# Patient Record
Sex: Male | Born: 1975 | Race: Black or African American | Hispanic: No | Marital: Single | Smoking: Never smoker
Health system: Southern US, Community
[De-identification: ages and names within clinical notes are randomized; demographics above are authoritative.]

## PROBLEM LIST (undated history)

## (undated) HISTORY — PX: OTHER SURGICAL HISTORY: SHX169

---

## 2006-03-15 ENCOUNTER — Emergency Department: Payer: Self-pay | Admitting: Emergency Medicine

## 2014-09-02 ENCOUNTER — Ambulatory Visit
Admit: 2014-09-02 | Disposition: A | Discharge status: Home / Self Care | Payer: Self-pay | Attending: Family Medicine | Admitting: Family Medicine

## 2016-02-16 ENCOUNTER — Encounter: Payer: Self-pay | Admitting: Emergency Medicine

## 2016-02-16 ENCOUNTER — Ambulatory Visit
Admission: EM | Admit: 2016-02-16 | Discharge: 2016-02-16 | Disposition: A | Discharge status: Home / Self Care | Payer: Managed Care, Other (non HMO) | Attending: Family Medicine | Admitting: Family Medicine

## 2016-02-16 DIAGNOSIS — M545 Low back pain, unspecified: Secondary | ICD-10-CM

## 2016-02-16 MED ORDER — HYDROCODONE-ACETAMINOPHEN 5-325 MG PO TABS: ORAL_TABLET | ORAL | 0 refills | Status: DC

## 2016-02-16 MED ORDER — CYCLOBENZAPRINE HCL 10 MG PO TABS: 10.0000 mg | ORAL_TABLET | Freq: Every day | ORAL | 0 refills | Status: DC

## 2016-02-16 NOTE — ED Provider Notes (Signed)
[facility]    CSN: [id number] Arrival date & time: 02/16/16  1041     History   Chief Complaint Chief Complaint  Patient presents with  . Back Pain    HPI Nthony Francesco RunnerH Shugart is a 40 y.o. male.   40 yo male with a c/o 4 days of right sided low back pain. Denies any falls or other traumatic injury, numbness/tingling, fevers, chills, saddle anesthesia, bowel/bladder problems,  pain radiating. Pain is worse with certain movements or positions.    The history is provided by the patient.    History reviewed. No pertinent past medical history.  There are no active problems to display for this patient.   History reviewed. No pertinent surgical history.     Home Medications    Prior to Admission medications   Medication Sig Start Date End Date Taking? Authorizing Provider  cyclobenzaprine (FLEXERIL) 10 MG tablet Take 1 tablet (10 mg total) by mouth at bedtime. 02/16/16   Payton Mccallumrlando Lorita Forinash, MD  HYDROcodone-acetaminophen (NORCO/VICODIN) 5-325 MG tablet 1-2 tablet po qhs prn 02/16/16   Payton Mccallumrlando Reade Trefz, MD    Family History History reviewed. No pertinent family history.  Social History Social History  Substance Use Topics  . Smoking status: Never Smoker  . Smokeless tobacco: Never Used  . Alcohol use Yes     Allergies   Penicillins   Review of Systems Review of Systems   Physical Exam Triage Vital Signs ED Triage Vitals  Enc Vitals Group     BP 02/16/16 1123 (!) 142/90     Pulse Rate 02/16/16 1123 80     Resp 02/16/16 1123 16     Temp 02/16/16 1123 97.6 F (36.4 C)     Temp Source 02/16/16 1123 Tympanic     SpO2 02/16/16 1123 98 %     Weight 02/16/16 1121 260 lb (117.9 kg)     Height 02/16/16 1121 6\' 2"  (1.88 m)     Head Circumference --      Peak Flow --      Pain Score 02/16/16 1123 8     Pain Loc --      Pain Edu? --      Excl. in GC? --    No data found.   Updated Vital Signs BP (!) 142/90 (BP Location: Left Arm)   Pulse 80   Temp 97.6  F (36.4 C) (Tympanic)   Resp 16   Ht 6\' 2"  (1.88 m)   Wt 260 lb (117.9 kg)   SpO2 98%   BMI 33.38 kg/m   Visual Acuity Right Eye Distance:   Left Eye Distance:   Bilateral Distance:    Right Eye Near:   Left Eye Near:    Bilateral Near:     Physical Exam  Constitutional: He appears well-developed and well-nourished. No distress.  Neck: Normal range of motion. Neck supple. No tracheal deviation present.  Pulmonary/Chest: Effort normal. No stridor. No respiratory distress.  Musculoskeletal:       Cervical back: He exhibits normal range of motion, no tenderness, no bony tenderness, no swelling, no edema, no deformity, no laceration, no pain, no spasm and normal pulse.       Lumbar back: He exhibits tenderness (over the right lumbar paraspinous muscles) and spasm. He exhibits normal range of motion, no bony tenderness, no swelling, no edema, no deformity, no laceration, no pain and normal pulse.  Neurological: He is alert. He has normal reflexes. He displays normal reflexes. He exhibits  normal muscle tone. Coordination normal.  Skin: No rash noted. He is not diaphoretic.  Nursing note and vitals reviewed.    UC Treatments / Results  Labs (all labs ordered are listed, but only abnormal results are displayed) Labs Reviewed - No data to display  EKG  EKG Interpretation None       Radiology No results found.  Procedures Procedures (including critical care time)  Medications Ordered in UC Medications - No data to display   Initial Impression / Assessment and Plan / UC Course  I have reviewed the triage vital signs and the nursing notes.  Pertinent labs & imaging results that were available during my care of the patient were reviewed by me and considered in my medical decision making (see chart for details).  Clinical Course      Final Clinical Impressions(s) / UC Diagnoses   Final diagnoses:  Acute right-sided low back pain without sciatica    New  Prescriptions Discharge Medication List as of 02/16/2016 11:40 AM    START taking these medications   Details  cyclobenzaprine (FLEXERIL) 10 MG tablet Take 1 tablet (10 mg total) by mouth at bedtime., Starting Mon 02/16/2016, Normal    HYDROcodone-acetaminophen (NORCO/VICODIN) 5-325 MG tablet 1-2 tablet po qhs prn, Print        1. diagnosis reviewed with patient 2. rx as per orders above; reviewed possible side effects, interactions, risks and benefits  3. Recommend supportive treatment with heat/ice, stretches 4. Follow-up prn if symptoms worsen or don't improve    Payton Mccallum, MD 02/16/16 1338

## 2016-02-16 NOTE — ED Triage Notes (Signed)
Patient c/o lower back pain that started Friday.  Patient denies injury.

## 2016-02-18 ENCOUNTER — Telehealth: Payer: Self-pay

## 2016-02-18 NOTE — Telephone Encounter (Signed)
Courtesy call back completed today for patient's recent visit at [facility]. Patient did not answer, left message on machine to call back with any questions or concerns.   

## 2017-01-18 ENCOUNTER — Ambulatory Visit: Payer: Self-pay | Admitting: Family Medicine

## 2017-02-22 ENCOUNTER — Ambulatory Visit: Payer: Self-pay | Admitting: Family Medicine

## 2018-02-03 ENCOUNTER — Emergency Department
Admission: EM | Admit: 2018-02-03 | Discharge: 2018-02-03 | Disposition: A | Discharge status: Home / Self Care | Payer: BLUE CROSS/BLUE SHIELD | Attending: Emergency Medicine | Admitting: Emergency Medicine

## 2018-02-03 ENCOUNTER — Emergency Department: Payer: BLUE CROSS/BLUE SHIELD

## 2018-02-03 ENCOUNTER — Other Ambulatory Visit: Payer: Self-pay

## 2018-02-03 DIAGNOSIS — N23 Unspecified renal colic: Secondary | ICD-10-CM

## 2018-02-03 DIAGNOSIS — N5082 Scrotal pain: Secondary | ICD-10-CM | POA: Diagnosis not present

## 2018-02-03 DIAGNOSIS — R1031 Right lower quadrant pain: Secondary | ICD-10-CM | POA: Diagnosis present

## 2018-02-03 DIAGNOSIS — N50811 Right testicular pain: Secondary | ICD-10-CM

## 2018-02-03 LAB — COMPREHENSIVE METABOLIC PANEL
ALT: 31 U/L (ref 0–44)
AST: 27 U/L (ref 15–41)
Albumin: 4.6 g/dL (ref 3.5–5.0)
Alkaline Phosphatase: 81 U/L (ref 38–126)
Anion gap: 9 (ref 5–15)
BUN: 14 mg/dL (ref 6–20)
CHLORIDE: 107 mmol/L (ref 98–111)
CO2: 24 mmol/L (ref 22–32)
CREATININE: 1.45 mg/dL — AB (ref 0.61–1.24)
Calcium: 9.2 mg/dL (ref 8.9–10.3)
GFR calc Af Amer: >60 mL/min (ref >60)
GFR calc non Af Amer: 58 mL/min — ABNORMAL LOW (ref >60)
Glucose, Bld: 100 mg/dL — ABNORMAL HIGH (ref 70–99)
Potassium: 3.9 mmol/L (ref 3.5–5.1)
SODIUM: 140 mmol/L (ref 135–145)
Total Bilirubin: 0.6 mg/dL (ref 0.3–1.2)
Total Protein: 8 g/dL (ref 6.5–8.1)

## 2018-02-03 LAB — URINALYSIS, COMPLETE (UACMP) WITH MICROSCOPIC
BACTERIA UA: NONE SEEN
Bilirubin Urine: NEGATIVE
GLUCOSE, UA: NEGATIVE mg/dL
Ketones, ur: NEGATIVE mg/dL
Leukocytes, UA: NEGATIVE
Nitrite: NEGATIVE
PROTEIN: NEGATIVE mg/dL
Specific Gravity, Urine: 1.011 (ref 1.005–1.030)
Squamous Epithelial / LPF: NONE SEEN (ref 0–5)
pH: 5 (ref 5.0–8.0)

## 2018-02-03 LAB — CBC WITH DIFFERENTIAL/PLATELET
BASOS ABS: 0 10*3/uL (ref 0–0.1)
Basophils Relative: 0 %
Eosinophils Absolute: 0.3 10*3/uL (ref 0–0.7)
Eosinophils Relative: 5 %
HCT: 44.1 % (ref 40.0–52.0)
HEMOGLOBIN: 14.5 g/dL (ref 13.0–18.0)
LYMPHS ABS: 2.4 10*3/uL (ref 1.0–3.6)
LYMPHS PCT: 39 %
MCH: 26.9 pg (ref 26.0–34.0)
MCHC: 33 g/dL (ref 32.0–36.0)
MCV: 81.6 fL (ref 80.0–100.0)
Monocytes Absolute: 0.8 10*3/uL (ref 0.2–1.0)
Monocytes Relative: 12 %
Neutro Abs: 2.7 10*3/uL (ref 1.4–6.5)
Neutrophils Relative %: 44 %
Platelets: 267 10*3/uL (ref 150–440)
RBC: 5.4 MIL/uL (ref 4.40–5.90)
RDW: 14.8 % — ABNORMAL HIGH (ref 11.5–14.5)
WBC: 6.2 10*3/uL (ref 3.8–10.6)

## 2018-02-03 MED ORDER — HYDROMORPHONE HCL 1 MG/ML IJ SOLN
INTRAMUSCULAR | Status: AC
  Administered 2018-02-03: 0.5 mg
  Filled 2018-02-03: qty 1

## 2018-02-03 MED ORDER — FENTANYL CITRATE (PF) 100 MCG/2ML IJ SOLN
50.0000 ug | INTRAMUSCULAR | Status: DC | PRN
  Administered 2018-02-03: 50 ug via INTRAVENOUS
  Filled 2018-02-03: qty 2

## 2018-02-03 MED ORDER — ONDANSETRON HCL 4 MG/2ML IJ SOLN
4.0000 mg | Freq: Once | INTRAMUSCULAR | Status: AC
  Administered 2018-02-03: 4 mg via INTRAVENOUS

## 2018-02-03 MED ORDER — ONDANSETRON HCL 4 MG/2ML IJ SOLN
INTRAMUSCULAR | Status: AC
  Administered 2018-02-03: 4 mg via INTRAVENOUS
  Filled 2018-02-03: qty 2

## 2018-02-03 MED ORDER — SODIUM CHLORIDE 0.9 % IV SOLN
0.5000 mg/h | INTRAVENOUS | Status: DC
  Filled 2018-02-03: qty 5

## 2018-02-03 MED ORDER — OXYCODONE-ACETAMINOPHEN 5-325 MG PO TABS: 1.0000 | ORAL_TABLET | ORAL | 0 refills | Status: AC | PRN

## 2018-02-03 NOTE — Discharge Instructions (Signed)
Your kidney stone has passed almost completely into the bladder on the CAT scan.  By this time it should be in all the way into the bladder.  We will just have some pain as you pass it out.  Please return for any bad pain fever vomiting or feeling sicker.  Please give Dr. Apolinar JunesBrandon urologist a call Monday morning to schedule a follow-up appointment with her.  Strain all your urine.  Try to catch a stone and take it to her.  You have 2 other very small stones up in the kidney still.  It is possible we may be able to get you on a medicine which we will keep you from getting more.  I will give you a prescription for some Percocet 1 pill 4 times a day if needed for severe pain.  If you do not need it after few days he can probably just dispose of it.

## 2018-02-03 NOTE — ED Triage Notes (Signed)
First Nurse Note:  C/O lower abdominal and scrotal pain x 2 hours.  Denies dysuria.

## 2018-02-03 NOTE — ED Triage Notes (Signed)
Severe right groin and right testicle pain since 1500, awoke him out of sleep. Pt wincing in pain.

## 2018-02-03 NOTE — ED Notes (Signed)
Pt waiting for his ride home to pick him up.

## 2018-02-03 NOTE — ED Provider Notes (Signed)
[facility] Emergency Department Provider Note   ____________________________________________   First MD Initiated Contact with Patient 02/03/18 1742     (approximate)  I have reviewed the triage vital signs and the nursing notes.   HISTORY  Chief Complaint Testicle Pain and Groin Pain    HPI Lawrence [name] is a 42 y.o. male reports she was sleeping and woke up for work and the pain woke him up actually pain it was in the right lower quadrant going into the right testicle is severe and sharp.  Slight difficulty urinating but no dysuria.  No blood in the urine no nausea no vomiting.  He is never had this before.  Pain was severe.  It went away with the pain shot now he is back to normal.  Lasted about 2 hours.   History reviewed. No pertinent past medical history.  There are no active problems to display for this patient.   History reviewed. No pertinent surgical history.  Prior to Admission medications   Medication Sig Start Date End Date Taking? Authorizing Provider  cyclobenzaprine (FLEXERIL) 10 MG tablet Take 1 tablet (10 mg total) by mouth at bedtime. 02/16/16   Payton Mccallum, MD  HYDROcodone-acetaminophen (NORCO/VICODIN) 5-325 MG tablet 1-2 tablet po qhs prn 02/16/16   Payton Mccallum, MD  oxyCODONE-acetaminophen (PERCOCET) 5-325 MG tablet Take 1 tablet by mouth every 4 (four) hours as needed for severe pain. 02/03/18 02/03/19  Arnaldo Natal, MD    Allergies Penicillins  History reviewed. No pertinent family history.  Social History Social History   Tobacco Use  . Smoking status: Never Smoker  . Smokeless tobacco: Never Used  Substance Use Topics  . Alcohol use: Yes  . Drug use: No    Review of Systems  Constitutional: No fever/chills Eyes: No visual changes. ENT: No sore throat. Cardiovascular: Denies chest pain. Respiratory: Denies shortness of breath. Gastrointestinal: At present no abdominal pain.  No nausea, no vomiting.  No  diarrhea.  No constipation. Genitourinary: Negative for dysuria. Musculoskeletal: Negative for back pain. Skin: Negative for rash. Neurological: Negative for headaches, focal weakness   ____________________________________________   PHYSICAL EXAM:  VITAL SIGNS: ED Triage Vitals  Enc Vitals Group     BP 02/03/18 1716 (!) 143/94     Pulse Rate 02/03/18 1716 79     Resp 02/03/18 1716 (!) 24     Temp 02/03/18 1716 98.1 F (36.7 C)     Temp Source 02/03/18 1716 Oral     SpO2 02/03/18 1716 98 %     Weight 02/03/18 1717 282 lb (127.9 kg)     Height 02/03/18 1717 6\' 2"  (1.88 m)     Head Circumference --      Peak Flow --      Pain Score 02/03/18 1717 10     Pain Loc --      Pain Edu? --      Excl. in GC? --     Constitutional: Alert and oriented. Well appearing and in no acute distress. Eyes: Conjunctivae are normal.  Head: Atraumatic. Nose: No congestion/rhinnorhea. Mouth/Throat: Mucous membranes are moist.  Oropharynx non-erythematous. Neck: No stridor.  Cardiovascular: Normal rate, regular rhythm. Grossly normal heart sounds.  Good peripheral circulation. Respiratory: Normal respiratory effort.  No retractions. Lungs CTAB. Gastrointestinal: Soft and nontender. No distention. No abdominal bruits. No CVA tenderness. Musculoskeletal: No lower extremity tenderness nor edema Neurologic:  Normal speech and language. No gross focal neurologic deficits are appreciated. No gait  instability. Skin:  Skin is warm, dry and intact. No rash noted. Psychiatric: Mood and affect are normal. Speech and behavior are normal.  ____________________________________________   LABS (all labs ordered are listed, but only abnormal results are displayed)  Labs Reviewed  URINALYSIS, COMPLETE (UACMP) WITH MICROSCOPIC - Abnormal; Notable for the following components:      Result Value   Color, Urine STRAW (*)    APPearance CLEAR (*)    Hgb urine dipstick LARGE (*)    All other components within  normal limits  COMPREHENSIVE METABOLIC PANEL - Abnormal; Notable for the following components:   Glucose, Bld 100 (*)    Creatinine, Ser 1.45 (*)    GFR calc non Af Amer 58 (*)    All other components within normal limits  CBC WITH DIFFERENTIAL/PLATELET - Abnormal; Notable for the following components:   RDW 14.8 (*)    All other components within normal limits   ____________________________________________  EKG   ____________________________________________  RADIOLOGY  ED MD interpretation: Ultrasound of scrotum read as negative no torsion.  Official radiology report(s): US Scrotum  Result Date: 02/03/2018 CLINICAL DATA:  Right scrotal pain. EXAM: SCROTAL ULTRASOUND DOPPLER ULTRASOUND OF THE TESTICLES TECHNIQUE: Complete ultrasound examination of the testicles, epididymis, and other scrotal structures was performed. Color and spectral Doppler ultrasound were also utilized to evaluate blood flow to the testicles. COMPARISON:  None. FINDINGS: Right testicle Measurements: 4.8 x 2.2 x 2.8 cm. No mass or microlithiasis visualized. Left testicle Measurements: 4.7 x 2.1 x 2.8 cm. No mass or microlithiasis visualized. Right epididymis:  Normal in size and appearance. Left epididymis:  Normal in size and appearance. Hydrocele:  Small bilateral hydroceles. Varicocele:  None visualized. Pulsed Doppler interrogation of both testes demonstrates normal low resistance arterial and venous waveforms bilaterally. IMPRESSION: 1. No acute findings. No testicular torsion or evidence of epididymitis/orchitis. 2. No testicular mass. 3. Small bilateral hydroceles.  No other abnormalities. Electronically Signed   By: Amie Portland M.D.   On: 02/03/2018 18:16   Ct Renal Stone Study  Result Date: 02/03/2018 CLINICAL DATA:  Lower abdominal and scrotal pain x2 hours. EXAM: CT ABDOMEN AND PELVIS WITHOUT CONTRAST TECHNIQUE: Multidetector CT imaging of the abdomen and pelvis was performed following the standard  protocol without IV contrast. COMPARISON:  None. FINDINGS: Lower chest: Heart size is normal. No pericardial effusion is identified. Lung bases are clear apart from subsegmental atelectasis at the lung bases. Hepatobiliary: Hepatic steatosis. Normal gallbladder. No stones or biliary dilatation. Pancreas: Normal Spleen: Normal Adrenals/Urinary Tract: Normal bilateral adrenal glands. Tiny nonobstructing bilateral renal calculi in the lower pole. Mild perinephric fat stranding and mild-to-moderate hydroureteronephrosis secondary to a 4 mm calculus that has almost passed completely into the bladder. The left ureter is unremarkable. Bladder is physiologically distended without focal mural thickening. Stomach/Bowel: Stomach is within normal limits. Appendix appears normal. No evidence of bowel wall thickening, distention, or inflammatory changes. Vascular/Lymphatic: No significant vascular findings are present. No enlarged abdominal or pelvic lymph nodes. Reproductive: Normal size prostate and seminal vesicles. Other: No free air nor free fluid. Small fat containing periumbilical hernia. Musculoskeletal: Discogenic sclerosis of the L4 inferior endplate associated with degenerative disc disease and Schmorl's nodes at L4-5. No acute nor aggressive osseous lesions. IMPRESSION: 1. Mild to moderate right-sided hydroureteronephrosis secondary to a 4 mm calculus that appears to have almost passed completely into the bladder at time of imaging. 2. Tiny punctate nonobstructing renal calculi are identified in the lower poles of both kidneys. 3.  Mild degenerative disc disease L4-5. Electronically Signed   By: Tollie Ethavid  Kwon M.D.   On: 02/03/2018 18:50   [place] Scrotum Doppler  Result Date: 02/03/2018 CLINICAL DATA:  Right scrotal pain. EXAM: SCROTAL ULTRASOUND DOPPLER ULTRASOUND OF THE TESTICLES TECHNIQUE: Complete ultrasound examination of the testicles, epididymis, and other scrotal structures was performed. Color and spectral  Doppler ultrasound were also utilized to evaluate blood flow to the testicles. COMPARISON:  None. FINDINGS: Right testicle Measurements: 4.8 x 2.2 x 2.8 cm. No mass or microlithiasis visualized. Left testicle Measurements: 4.7 x 2.1 x 2.8 cm. No mass or microlithiasis visualized. Right epididymis:  Normal in size and appearance. Left epididymis:  Normal in size and appearance. Hydrocele:  Small bilateral hydroceles. Varicocele:  None visualized. Pulsed Doppler interrogation of both testes demonstrates normal low resistance arterial and venous waveforms bilaterally. IMPRESSION: 1. No acute findings. No testicular torsion or evidence of epididymitis/orchitis. 2. No testicular mass. 3. Small bilateral hydroceles.  No other abnormalities. Electronically Signed   By: Amie Portlandavid  Ormond M.D.   On: 02/03/2018 18:16    ____________________________________________   PROCEDURES  Procedure(s) performed:   Procedures  Critical Care performed:   ____________________________________________   INITIAL IMPRESSION / ASSESSMENT AND PLAN / ED COURSE   On discharge patient's pain is much better in fact is gone.  Stone on the CT looked like it was just about to pop into the bladder.  He probably has.  I will give him some Percocet anyway just in case.  He will follow-up with the urologist.       ____________________________________________   FINAL CLINICAL IMPRESSION(S) / ED DIAGNOSES  Final diagnoses:  Renal colic on right side     ED Discharge Orders         Ordered    oxyCODONE-acetaminophen (PERCOCET) 5-325 MG tablet  Every 4 hours PRN     02/03/18 1858           Note:  This document was prepared using Dragon voice recognition software and may include unintentional dictation errors.    Arnaldo NatalMalinda, Paul F, MD 02/03/18 [phone]

## 2020-04-30 ENCOUNTER — Ambulatory Visit
Admission: EM | Admit: 2020-04-30 | Discharge: 2020-04-30 | Disposition: A | Discharge status: Home / Self Care | Payer: BLUE CROSS/BLUE SHIELD

## 2020-09-19 ENCOUNTER — Encounter (HOSPITAL_COMMUNITY): Admission: EM | Disposition: A | Discharge status: Home / Self Care | Payer: Self-pay | Source: Home / Self Care

## 2020-09-19 ENCOUNTER — Inpatient Hospital Stay (HOSPITAL_COMMUNITY): Payer: 59

## 2020-09-19 ENCOUNTER — Inpatient Hospital Stay (HOSPITAL_COMMUNITY)
Admission: EM | Admit: 2020-09-19 | Discharge: 2020-09-23 | DRG: 482 | Disposition: A | Discharge status: Home / Self Care | Payer: 59 | Attending: General Surgery | Admitting: General Surgery

## 2020-09-19 ENCOUNTER — Emergency Department (HOSPITAL_COMMUNITY): Payer: 59

## 2020-09-19 ENCOUNTER — Encounter (HOSPITAL_COMMUNITY): Payer: Self-pay | Admitting: Emergency Medicine

## 2020-09-19 ENCOUNTER — Other Ambulatory Visit: Payer: Self-pay

## 2020-09-19 ENCOUNTER — Inpatient Hospital Stay (HOSPITAL_COMMUNITY): Payer: 59 | Admitting: Certified Registered"

## 2020-09-19 DIAGNOSIS — S7291XA Unspecified fracture of right femur, initial encounter for closed fracture: Secondary | ICD-10-CM

## 2020-09-19 DIAGNOSIS — Z419 Encounter for procedure for purposes other than remedying health state, unspecified: Secondary | ICD-10-CM

## 2020-09-19 DIAGNOSIS — M79605 Pain in left leg: Secondary | ICD-10-CM

## 2020-09-19 DIAGNOSIS — S7290XA Unspecified fracture of unspecified femur, initial encounter for closed fracture: Secondary | ICD-10-CM

## 2020-09-19 DIAGNOSIS — R0602 Shortness of breath: Secondary | ICD-10-CM

## 2020-09-19 DIAGNOSIS — T1490XA Injury, unspecified, initial encounter: Principal | ICD-10-CM

## 2020-09-19 DIAGNOSIS — S08129A Partial traumatic amputation of unspecified ear, initial encounter: Secondary | ICD-10-CM

## 2020-09-19 DIAGNOSIS — Z23 Encounter for immunization: Secondary | ICD-10-CM | POA: Diagnosis not present

## 2020-09-19 DIAGNOSIS — S71111A Laceration without foreign body, right thigh, initial encounter: Secondary | ICD-10-CM | POA: Diagnosis present

## 2020-09-19 DIAGNOSIS — Z20822 Contact with and (suspected) exposure to covid-19: Secondary | ICD-10-CM | POA: Diagnosis present

## 2020-09-19 DIAGNOSIS — S72301A Unspecified fracture of shaft of right femur, initial encounter for closed fracture: Secondary | ICD-10-CM | POA: Diagnosis present

## 2020-09-19 DIAGNOSIS — Y92410 Unspecified street and highway as the place of occurrence of the external cause: Secondary | ICD-10-CM

## 2020-09-19 DIAGNOSIS — S08121A Partial traumatic amputation of right ear, initial encounter: Secondary | ICD-10-CM | POA: Diagnosis present

## 2020-09-19 DIAGNOSIS — Z880 Allergy status to penicillin: Secondary | ICD-10-CM

## 2020-09-19 HISTORY — PX: FEMUR IM NAIL: SHX1597

## 2020-09-19 LAB — POCT I-STAT, CHEM 8
BUN: 14 mg/dL (ref 6–20)
Calcium, Ion: 1.17 mmol/L (ref 1.15–1.40)
Chloride: 104 mmol/L (ref 98–111)
Creatinine, Ser: 1.3 mg/dL — ABNORMAL HIGH (ref 0.61–1.24)
Glucose, Bld: 130 mg/dL — ABNORMAL HIGH (ref 70–99)
HCT: 36 % — ABNORMAL LOW (ref 39.0–52.0)
Hemoglobin: 12.2 g/dL — ABNORMAL LOW (ref 13.0–17.0)
Potassium: 4.3 mmol/L (ref 3.5–5.1)
Sodium: 142 mmol/L (ref 135–145)
TCO2: 23 mmol/L (ref 22–32)

## 2020-09-19 LAB — RESP PANEL BY RT-PCR (FLU A&B, COVID) ARPGX2
Influenza A by PCR: NEGATIVE
Influenza B by PCR: NEGATIVE
SARS Coronavirus 2 by RT PCR: NEGATIVE

## 2020-09-19 LAB — URINALYSIS, ROUTINE W REFLEX MICROSCOPIC
Bilirubin Urine: NEGATIVE
Glucose, UA: NEGATIVE mg/dL
Hgb urine dipstick: NEGATIVE
Ketones, ur: NEGATIVE mg/dL
Leukocytes,Ua: NEGATIVE
Nitrite: NEGATIVE
Protein, ur: 30 mg/dL — AB
Specific Gravity, Urine: 1.016 (ref 1.005–1.030)
pH: 5 (ref 5.0–8.0)

## 2020-09-19 LAB — COMPREHENSIVE METABOLIC PANEL
ALT: 35 U/L (ref 0–44)
AST: 43 U/L — ABNORMAL HIGH (ref 15–41)
Albumin: 3.7 g/dL (ref 3.5–5.0)
Alkaline Phosphatase: 64 U/L (ref 38–126)
Anion gap: 7 (ref 5–15)
BUN: 12 mg/dL (ref 6–20)
CO2: 22 mmol/L (ref 22–32)
Calcium: 8.8 mg/dL — ABNORMAL LOW (ref 8.9–10.3)
Chloride: 109 mmol/L (ref 98–111)
Creatinine, Ser: 1.3 mg/dL — ABNORMAL HIGH (ref 0.61–1.24)
GFR, Estimated: >60 mL/min (ref >60)
Glucose, Bld: 122 mg/dL — ABNORMAL HIGH (ref 70–99)
Potassium: 3.7 mmol/L (ref 3.5–5.1)
Sodium: 138 mmol/L (ref 135–145)
Total Bilirubin: 0.4 mg/dL (ref 0.3–1.2)
Total Protein: 6.7 g/dL (ref 6.5–8.1)

## 2020-09-19 LAB — I-STAT CHEM 8, ED
BUN: 16 mg/dL (ref 6–20)
Calcium, Ion: 1.05 mmol/L — ABNORMAL LOW (ref 1.15–1.40)
Chloride: 107 mmol/L (ref 98–111)
Creatinine, Ser: 1.3 mg/dL — ABNORMAL HIGH (ref 0.61–1.24)
Glucose, Bld: 123 mg/dL — ABNORMAL HIGH (ref 70–99)
HCT: 44 % (ref 39.0–52.0)
Hemoglobin: 15 g/dL (ref 13.0–17.0)
Potassium: 3.8 mmol/L (ref 3.5–5.1)
Sodium: 140 mmol/L (ref 135–145)
TCO2: 22 mmol/L (ref 22–32)

## 2020-09-19 LAB — CBC
HCT: 44.4 % (ref 39.0–52.0)
Hemoglobin: 13.5 g/dL (ref 13.0–17.0)
MCH: 26.5 pg (ref 26.0–34.0)
MCHC: 30.4 g/dL (ref 30.0–36.0)
MCV: 87.2 fL (ref 80.0–100.0)
Platelets: 286 10*3/uL (ref 150–400)
RBC: 5.09 MIL/uL (ref 4.22–5.81)
RDW: 14.5 % (ref 11.5–15.5)
WBC: 10.3 10*3/uL (ref 4.0–10.5)
nRBC: 0 % (ref 0.0–0.2)

## 2020-09-19 LAB — LACTIC ACID, PLASMA: Lactic Acid, Venous: 2 mmol/L (ref 0.5–1.9)

## 2020-09-19 LAB — HIV ANTIBODY (ROUTINE TESTING W REFLEX): HIV Screen 4th Generation wRfx: NONREACTIVE

## 2020-09-19 LAB — SURGICAL PCR SCREEN
MRSA, PCR: NEGATIVE
Staphylococcus aureus: POSITIVE — AB

## 2020-09-19 LAB — SAMPLE TO BLOOD BANK

## 2020-09-19 LAB — PROTIME-INR
INR: 1.1 (ref 0.8–1.2)
Prothrombin Time: 13.8 seconds (ref 11.4–15.2)

## 2020-09-19 LAB — ETHANOL: Alcohol, Ethyl (B): <10 mg/dL (ref <10)

## 2020-09-19 SURGERY — INSERTION, INTRAMEDULLARY ROD, FEMUR
Anesthesia: General | Laterality: Right

## 2020-09-19 MED ORDER — HYDROMORPHONE HCL 1 MG/ML IJ SOLN
0.5000 mg | INTRAMUSCULAR | Status: DC | PRN
  Administered 2020-09-19 (×3): 1 mg via INTRAVENOUS
  Filled 2020-09-19 (×3): qty 1

## 2020-09-19 MED ORDER — ONDANSETRON HCL 4 MG PO TABS: 4.0000 mg | ORAL_TABLET | Freq: Four times a day (QID) | ORAL | Status: DC | PRN

## 2020-09-19 MED ORDER — PHENYLEPHRINE HCL (PRESSORS) 10 MG/ML IV SOLN
INTRAVENOUS | Status: AC
  Filled 2020-09-19: qty 1

## 2020-09-19 MED ORDER — 0.9 % SODIUM CHLORIDE (POUR BTL) OPTIME
TOPICAL | Status: DC | PRN
  Administered 2020-09-19: 1000 mL

## 2020-09-19 MED ORDER — CHLORHEXIDINE GLUCONATE 0.12 % MT SOLN: 15.0000 mL | Freq: Once | OROMUCOSAL | Status: AC

## 2020-09-19 MED ORDER — DOCUSATE SODIUM 100 MG PO CAPS: 100.0000 mg | ORAL_CAPSULE | Freq: Two times a day (BID) | ORAL | Status: DC

## 2020-09-19 MED ORDER — DOCUSATE SODIUM 100 MG PO CAPS
100.0000 mg | ORAL_CAPSULE | Freq: Two times a day (BID) | ORAL | Status: DC
  Administered 2020-09-19 – 2020-09-23 (×8): 100 mg via ORAL
  Filled 2020-09-19 (×8): qty 1

## 2020-09-19 MED ORDER — LIDOCAINE HCL (CARDIAC) PF 50 MG/5ML IV SOSY
PREFILLED_SYRINGE | INTRAVENOUS | Status: DC | PRN
  Administered 2020-09-19: 100 mg via INTRAVENOUS

## 2020-09-19 MED ORDER — VANCOMYCIN HCL 500 MG/100ML IV SOLN
500.0000 mg | INTRAVENOUS | Status: AC
  Administered 2020-09-19: 500 mg via INTRAVENOUS
  Filled 2020-09-19: qty 100

## 2020-09-19 MED ORDER — VANCOMYCIN HCL 1500 MG/300ML IV SOLN
1500.0000 mg | INTRAVENOUS | Status: DC
  Filled 2020-09-19: qty 300

## 2020-09-19 MED ORDER — SUGAMMADEX SODIUM 200 MG/2ML IV SOLN
INTRAVENOUS | Status: DC | PRN
  Administered 2020-09-19: 260 mg via INTRAVENOUS

## 2020-09-19 MED ORDER — METHOCARBAMOL 1000 MG/10ML IJ SOLN
500.0000 mg | Freq: Four times a day (QID) | INTRAVENOUS | Status: DC | PRN
  Filled 2020-09-19: qty 5

## 2020-09-19 MED ORDER — DEXAMETHASONE SODIUM PHOSPHATE 10 MG/ML IJ SOLN
INTRAMUSCULAR | Status: AC
  Filled 2020-09-19: qty 1

## 2020-09-19 MED ORDER — OXYCODONE HCL 5 MG PO TABS
10.0000 mg | ORAL_TABLET | ORAL | Status: DC | PRN
  Administered 2020-09-22: 10 mg via ORAL
  Filled 2020-09-19: qty 2

## 2020-09-19 MED ORDER — VANCOMYCIN HCL 1000 MG IV SOLR
INTRAVENOUS | Status: DC | PRN
  Administered 2020-09-19: 1000 mg via INTRAVENOUS

## 2020-09-19 MED ORDER — VANCOMYCIN HCL 1000 MG/200ML IV SOLN
1000.0000 mg | Freq: Two times a day (BID) | INTRAVENOUS | Status: AC
  Administered 2020-09-19: 1000 mg via INTRAVENOUS
  Filled 2020-09-19: qty 200

## 2020-09-19 MED ORDER — FENTANYL CITRATE (PF) 100 MCG/2ML IJ SOLN
INTRAMUSCULAR | Status: DC | PRN
  Administered 2020-09-19: 25 ug via INTRAVENOUS
  Administered 2020-09-19: 50 ug via INTRAVENOUS
  Administered 2020-09-19: 100 ug via INTRAVENOUS
  Administered 2020-09-19: 50 ug via INTRAVENOUS

## 2020-09-19 MED ORDER — ALBUMIN HUMAN 5 % IV SOLN: INTRAVENOUS | Status: DC | PRN

## 2020-09-19 MED ORDER — MIDAZOLAM HCL 5 MG/5ML IJ SOLN
INTRAMUSCULAR | Status: DC | PRN
  Administered 2020-09-19 (×2): 1 mg via INTRAVENOUS

## 2020-09-19 MED ORDER — METOCLOPRAMIDE HCL 5 MG/ML IJ SOLN: 5.0000 mg | Freq: Three times a day (TID) | INTRAMUSCULAR | Status: DC | PRN

## 2020-09-19 MED ORDER — POVIDONE-IODINE 10 % EX SWAB: 2.0000 "application " | Freq: Once | CUTANEOUS | Status: DC

## 2020-09-19 MED ORDER — ONDANSETRON HCL 4 MG/2ML IJ SOLN
INTRAMUSCULAR | Status: AC
  Filled 2020-09-19: qty 4

## 2020-09-19 MED ORDER — IOHEXOL 300 MG/ML  SOLN
100.0000 mL | Freq: Once | INTRAMUSCULAR | Status: AC | PRN
  Administered 2020-09-19: 100 mL via INTRAVENOUS

## 2020-09-19 MED ORDER — ACETAMINOPHEN 500 MG PO TABS
1000.0000 mg | ORAL_TABLET | Freq: Four times a day (QID) | ORAL | Status: DC
  Administered 2020-09-19 – 2020-09-23 (×13): 1000 mg via ORAL
  Filled 2020-09-19 (×14): qty 2

## 2020-09-19 MED ORDER — VANCOMYCIN HCL 1000 MG IV SOLR: INTRAVENOUS | Status: DC | PRN

## 2020-09-19 MED ORDER — ROCURONIUM BROMIDE 100 MG/10ML IV SOLN
INTRAVENOUS | Status: DC | PRN
  Administered 2020-09-19: 60 mg via INTRAVENOUS

## 2020-09-19 MED ORDER — METOPROLOL TARTRATE 5 MG/5ML IV SOLN: 5.0000 mg | Freq: Four times a day (QID) | INTRAVENOUS | Status: DC | PRN

## 2020-09-19 MED ORDER — ONDANSETRON HCL 4 MG/2ML IJ SOLN: 4.0000 mg | Freq: Four times a day (QID) | INTRAMUSCULAR | Status: DC | PRN

## 2020-09-19 MED ORDER — SODIUM CHLORIDE 0.9 % IV SOLN
1.0000 g | Freq: Once | INTRAVENOUS | Status: AC
  Administered 2020-09-19 (×2): 1 g via INTRAVENOUS
  Filled 2020-09-19: qty 10

## 2020-09-19 MED ORDER — OXYCODONE HCL 5 MG PO TABS: 5.0000 mg | ORAL_TABLET | ORAL | Status: DC | PRN

## 2020-09-19 MED ORDER — METOCLOPRAMIDE HCL 5 MG PO TABS: 5.0000 mg | ORAL_TABLET | Freq: Three times a day (TID) | ORAL | Status: DC | PRN

## 2020-09-19 MED ORDER — ORAL CARE MOUTH RINSE: 15.0000 mL | Freq: Once | OROMUCOSAL | Status: AC

## 2020-09-19 MED ORDER — FENTANYL CITRATE (PF) 250 MCG/5ML IJ SOLN
INTRAMUSCULAR | Status: AC
  Filled 2020-09-19: qty 5

## 2020-09-19 MED ORDER — OXYCODONE HCL 5 MG PO TABS: 10.0000 mg | ORAL_TABLET | ORAL | Status: DC | PRN

## 2020-09-19 MED ORDER — KCL IN DEXTROSE-NACL 20-5-0.45 MEQ/L-%-% IV SOLN
INTRAVENOUS | Status: DC
  Filled 2020-09-19 (×9): qty 1000

## 2020-09-19 MED ORDER — TRANEXAMIC ACID-NACL 1000-0.7 MG/100ML-% IV SOLN
1000.0000 mg | Freq: Once | INTRAVENOUS | Status: AC
  Administered 2020-09-19: 1000 mg via INTRAVENOUS
  Filled 2020-09-19: qty 100

## 2020-09-19 MED ORDER — LACTATED RINGERS IV SOLN: INTRAVENOUS | Status: DC

## 2020-09-19 MED ORDER — PHENYLEPHRINE 40 MCG/ML (10ML) SYRINGE FOR IV PUSH (FOR BLOOD PRESSURE SUPPORT)
PREFILLED_SYRINGE | INTRAVENOUS | Status: AC
  Filled 2020-09-19: qty 20

## 2020-09-19 MED ORDER — TRANEXAMIC ACID-NACL 1000-0.7 MG/100ML-% IV SOLN
1000.0000 mg | INTRAVENOUS | Status: AC
  Administered 2020-09-19: 1000 mg via INTRAVENOUS
  Filled 2020-09-19: qty 100

## 2020-09-19 MED ORDER — HYDROMORPHONE HCL 1 MG/ML IJ SOLN
1.0000 mg | Freq: Once | INTRAMUSCULAR | Status: AC
  Administered 2020-09-19: 1 mg via INTRAVENOUS
  Filled 2020-09-19: qty 1

## 2020-09-19 MED ORDER — OXYCODONE HCL 5 MG PO TABS
5.0000 mg | ORAL_TABLET | ORAL | Status: DC | PRN
  Administered 2020-09-20: 5 mg via ORAL
  Filled 2020-09-19: qty 1
  Filled 2020-09-19: qty 2

## 2020-09-19 MED ORDER — TETANUS-DIPHTH-ACELL PERTUSSIS 5-2.5-18.5 LF-MCG/0.5 IM SUSY
0.5000 mL | PREFILLED_SYRINGE | Freq: Once | INTRAMUSCULAR | Status: AC
  Administered 2020-09-19: 0.5 mL via INTRAMUSCULAR
  Filled 2020-09-19: qty 0.5

## 2020-09-19 MED ORDER — POLYETHYLENE GLYCOL 3350 17 G PO PACK: 17.0000 g | PACK | Freq: Every day | ORAL | Status: DC | PRN

## 2020-09-19 MED ORDER — CHLORHEXIDINE GLUCONATE 0.12 % MT SOLN
OROMUCOSAL | Status: AC
  Administered 2020-09-19: 15 mL via OROMUCOSAL
  Filled 2020-09-19: qty 15

## 2020-09-19 MED ORDER — HYDROMORPHONE HCL 1 MG/ML IJ SOLN
0.5000 mg | INTRAMUSCULAR | Status: DC | PRN
  Administered 2020-09-19: 1 mg via INTRAVENOUS
  Filled 2020-09-19: qty 1

## 2020-09-19 MED ORDER — MIDAZOLAM HCL 2 MG/2ML IJ SOLN
INTRAMUSCULAR | Status: AC
  Filled 2020-09-19: qty 2

## 2020-09-19 MED ORDER — ROCURONIUM BROMIDE 10 MG/ML (PF) SYRINGE
PREFILLED_SYRINGE | INTRAVENOUS | Status: AC
  Filled 2020-09-19: qty 20

## 2020-09-19 MED ORDER — LIDOCAINE 2% (20 MG/ML) 5 ML SYRINGE
INTRAMUSCULAR | Status: AC
  Filled 2020-09-19: qty 20

## 2020-09-19 MED ORDER — CHLORHEXIDINE GLUCONATE 4 % EX LIQD: 60.0000 mL | Freq: Once | CUTANEOUS | Status: DC

## 2020-09-19 MED ORDER — ONDANSETRON 4 MG PO TBDP: 4.0000 mg | ORAL_TABLET | Freq: Four times a day (QID) | ORAL | Status: DC | PRN

## 2020-09-19 MED ORDER — PHENYLEPHRINE HCL-NACL 10-0.9 MG/250ML-% IV SOLN
INTRAVENOUS | Status: DC | PRN
  Administered 2020-09-19: 50 ug/min via INTRAVENOUS

## 2020-09-19 MED ORDER — POLYETHYLENE GLYCOL 3350 17 G PO PACK
17.0000 g | PACK | Freq: Every day | ORAL | Status: DC
  Administered 2020-09-22: 17 g via ORAL
  Filled 2020-09-19 (×3): qty 1

## 2020-09-19 MED ORDER — VANCOMYCIN HCL IN DEXTROSE 1-5 GM/200ML-% IV SOLN
INTRAVENOUS | Status: AC
  Filled 2020-09-19: qty 200

## 2020-09-19 MED ORDER — PHENYLEPHRINE 40 MCG/ML (10ML) SYRINGE FOR IV PUSH (FOR BLOOD PRESSURE SUPPORT)
PREFILLED_SYRINGE | INTRAVENOUS | Status: DC | PRN
  Administered 2020-09-19: 120 ug via INTRAVENOUS
  Administered 2020-09-19: 80 ug via INTRAVENOUS

## 2020-09-19 MED ORDER — PROPOFOL 10 MG/ML IV BOLUS
INTRAVENOUS | Status: DC | PRN
  Administered 2020-09-19: 200 mg via INTRAVENOUS

## 2020-09-19 MED ORDER — DEXAMETHASONE SODIUM PHOSPHATE 4 MG/ML IJ SOLN
INTRAMUSCULAR | Status: DC | PRN
  Administered 2020-09-19: 5 mg via INTRAVENOUS

## 2020-09-19 MED ORDER — VANCOMYCIN HCL 1000 MG IV SOLR
INTRAVENOUS | Status: AC
  Filled 2020-09-19: qty 1000

## 2020-09-19 MED ORDER — METHOCARBAMOL 500 MG PO TABS
500.0000 mg | ORAL_TABLET | Freq: Four times a day (QID) | ORAL | Status: DC | PRN
  Administered 2020-09-20 – 2020-09-22 (×3): 500 mg via ORAL
  Filled 2020-09-19 (×3): qty 1

## 2020-09-19 MED ORDER — LIDOCAINE-EPINEPHRINE (PF) 2 %-1:200000 IJ SOLN
20.0000 mL | Freq: Once | INTRAMUSCULAR | Status: AC
  Administered 2020-09-19: 20 mL
  Filled 2020-09-19: qty 20

## 2020-09-19 MED ORDER — CEFAZOLIN SODIUM 1 G IJ SOLR
INTRAMUSCULAR | Status: AC
  Filled 2020-09-19: qty 40

## 2020-09-19 MED ORDER — ONDANSETRON HCL 4 MG/2ML IJ SOLN
INTRAMUSCULAR | Status: DC | PRN
  Administered 2020-09-19: 4 mg via INTRAVENOUS

## 2020-09-19 MED ORDER — ACETAMINOPHEN 325 MG PO TABS: 325.0000 mg | ORAL_TABLET | Freq: Four times a day (QID) | ORAL | Status: DC | PRN

## 2020-09-19 MED ORDER — ENOXAPARIN SODIUM 30 MG/0.3ML IJ SOSY
30.0000 mg | PREFILLED_SYRINGE | Freq: Two times a day (BID) | INTRAMUSCULAR | Status: DC
  Administered 2020-09-20 – 2020-09-23 (×7): 30 mg via SUBCUTANEOUS
  Filled 2020-09-19 (×7): qty 0.3

## 2020-09-19 SURGICAL SUPPLY — 67 items
BIT DRILL CALIBRATED 4.2 (BIT) ×1 IMPLANT
BIT DRILL SHORT 4.2 (BIT) ×2 IMPLANT
BLADE SURG 10 STRL SS (BLADE) ×4 IMPLANT
BNDG COHESIVE 4X5 TAN STRL (GAUZE/BANDAGES/DRESSINGS) ×2 IMPLANT
BNDG COHESIVE 6X5 TAN STRL LF (GAUZE/BANDAGES/DRESSINGS) IMPLANT
BNDG ELASTIC 4X5.8 VLCR STR LF (GAUZE/BANDAGES/DRESSINGS) ×2 IMPLANT
BNDG ELASTIC 6X10 VLCR STRL LF (GAUZE/BANDAGES/DRESSINGS) ×2 IMPLANT
BNDG ELASTIC 6X5.8 VLCR STR LF (GAUZE/BANDAGES/DRESSINGS) ×2 IMPLANT
BRUSH SCRUB EZ PLAIN DRY (MISCELLANEOUS) ×4 IMPLANT
CHLORAPREP W/TINT 26 (MISCELLANEOUS) ×2 IMPLANT
COVER SURGICAL LIGHT HANDLE (MISCELLANEOUS) ×2 IMPLANT
COVER WAND RF STERILE (DRAPES) ×2 IMPLANT
DERMABOND ADHESIVE PROPEN (GAUZE/BANDAGES/DRESSINGS) ×1
DERMABOND ADVANCED .7 DNX6 (GAUZE/BANDAGES/DRESSINGS) ×1 IMPLANT
DRAPE C-ARM 35X43 STRL (DRAPES) ×2 IMPLANT
DRAPE C-ARMOR (DRAPES) ×2 IMPLANT
DRAPE HALF SHEET 40X57 (DRAPES) ×4 IMPLANT
DRAPE IMP U-DRAPE 54X76 (DRAPES) ×4 IMPLANT
DRAPE INCISE IOBAN 66X45 STRL (DRAPES) ×2 IMPLANT
DRAPE ORTHO SPLIT 77X108 STRL (DRAPES) ×2
DRAPE SURG 17X23 STRL (DRAPES) ×2 IMPLANT
DRAPE SURG ORHT 6 SPLT 77X108 (DRAPES) ×2 IMPLANT
DRAPE U-SHAPE 47X51 STRL (DRAPES) ×2 IMPLANT
DRESSING MEPILEX FLEX 4X4 (GAUZE/BANDAGES/DRESSINGS) ×1 IMPLANT
DRILL BIT CALIBRATED 4.2 (BIT) ×2
DRILL BIT SHORT 4.2 (BIT) ×2
DRSG MEPILEX BORDER 4X4 (GAUZE/BANDAGES/DRESSINGS) ×6 IMPLANT
DRSG MEPILEX BORDER 4X8 (GAUZE/BANDAGES/DRESSINGS) ×4 IMPLANT
DRSG MEPILEX FLEX 4X4 (GAUZE/BANDAGES/DRESSINGS) ×2
ELECT REM PT RETURN 9FT ADLT (ELECTROSURGICAL) ×2
ELECTRODE REM PT RTRN 9FT ADLT (ELECTROSURGICAL) ×1 IMPLANT
GLOVE BIO SURGEON STRL SZ 6.5 (GLOVE) ×6 IMPLANT
GLOVE BIO SURGEON STRL SZ7.5 (GLOVE) ×6 IMPLANT
GLOVE BIOGEL PI IND STRL 7.5 (GLOVE) ×1 IMPLANT
GLOVE BIOGEL PI INDICATOR 7.5 (GLOVE) ×1
GLOVE SURG UNDER POLY LF SZ6.5 (GLOVE) ×2 IMPLANT
GOWN STRL REUS W/ TWL LRG LVL3 (GOWN DISPOSABLE) ×3 IMPLANT
GOWN STRL REUS W/ TWL XL LVL3 (GOWN DISPOSABLE) ×1 IMPLANT
GOWN STRL REUS W/TWL LRG LVL3 (GOWN DISPOSABLE) ×3
GOWN STRL REUS W/TWL XL LVL3 (GOWN DISPOSABLE) ×1
GUIDEWIRE 3.2X400 (WIRE) ×6 IMPLANT
KIT BASIN OR (CUSTOM PROCEDURE TRAY) ×2 IMPLANT
KIT TURNOVER KIT B (KITS) ×2 IMPLANT
MANIFOLD NEPTUNE II (INSTRUMENTS) ×2 IMPLANT
NAIL CANN TI FEM RT PF 10X440 (Nail) ×2 IMPLANT
NS IRRIG 1000ML POUR BTL (IV SOLUTION) ×2 IMPLANT
PACK GENERAL/GYN (CUSTOM PROCEDURE TRAY) ×2 IMPLANT
PAD ARMBOARD 7.5X6 YLW CONV (MISCELLANEOUS) ×4 IMPLANT
PADDING CAST COTTON 6X4 STRL (CAST SUPPLIES) ×2 IMPLANT
REAMER ROD DEEP FLUTE 2.5X950 (INSTRUMENTS) ×2 IMPLANT
SCREW LOCK IM 44X5X (Screw) ×1 IMPLANT
SCREW LOCK IM 5X86 (Screw) ×2 IMPLANT
SCREW LOCK IM NAIL 5X44 (Screw) ×1 IMPLANT
SCREW LOCK IM NAIL 5X54 (Screw) ×2 IMPLANT
SCREW LOCK IM NAIL 5X62 (Screw) ×2 IMPLANT
STAPLER VISISTAT 35W (STAPLE) ×2 IMPLANT
STOCKINETTE IMPERVIOUS LG (DRAPES) ×2 IMPLANT
SUT ETHILON 3 0 PS 1 (SUTURE) ×2 IMPLANT
SUT MNCRL AB 3-0 PS2 18 (SUTURE) ×2 IMPLANT
SUT VIC AB 0 CT1 27 (SUTURE)
SUT VIC AB 0 CT1 27XBRD ANBCTR (SUTURE) IMPLANT
SUT VIC AB 2-0 CT1 27 (SUTURE) ×2
SUT VIC AB 2-0 CT1 TAPERPNT 27 (SUTURE) ×2 IMPLANT
TOWEL GREEN STERILE (TOWEL DISPOSABLE) ×4 IMPLANT
TOWEL GREEN STERILE FF (TOWEL DISPOSABLE) ×2 IMPLANT
UNDERPAD 30X36 HEAVY ABSORB (UNDERPADS AND DIAPERS) ×2 IMPLANT
WATER STERILE IRR 1000ML POUR (IV SOLUTION) ×2 IMPLANT
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
## 2020-09-19 NOTE — Progress Notes (Signed)
X ray to RLE completed at this time

## 2020-09-19 NOTE — Progress Notes (Signed)
Patient noted to have condom cath in place without success of staying noted to have 200cc of yellow urine

## 2020-09-19 NOTE — Anesthesia Procedure Notes (Signed)
Procedure Name: Intubation Date/Time: 09/19/2020 4:03 PM Performed by: Edmonia Caprio, CRNA Pre-anesthesia Checklist: Patient identified, Emergency Drugs available, Suction available, Patient being monitored and Timeout performed Patient Re-evaluated:Patient Re-evaluated prior to induction Oxygen Delivery Method: Circle system utilized Preoxygenation: Pre-oxygenation with 100% oxygen Induction Type: IV induction Ventilation: Two handed mask ventilation required and Oral airway inserted - appropriate to patient size Laryngoscope Size: Hyacinth Meeker and 2 Grade View: Grade III Tube type: Oral Tube size: 7.5 mm Number of attempts: 1 Airway Equipment and Method: Stylet Placement Confirmation: ETT inserted through vocal cords under direct vision,  positive ETCO2 and breath sounds checked- equal and bilateral Secured at: 23 cm Tube secured with: Tape Dental Injury: Teeth and Oropharynx as per pre-operative assessment

## 2020-09-19 NOTE — Progress Notes (Signed)
   09/19/20 0700  Clinical Encounter Type  Visited With Patient not available  Visit Type Trauma  Referral From Nurse  Consult/Referral To Chaplain  This chaplain followed up with patient. Patient not in room. No family is present. Chaplain remains available if needed.This note was prepared by Deneen Harts, M.Div..  For questions please contact by phone [phone].
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
## 2020-09-19 NOTE — Plan of Care (Signed)

## 2020-09-19 NOTE — Progress Notes (Signed)
Orthopedic Tech Progress Note Patient Details:  Lawrence [name] 07-11-1975 [id number]  Musculoskeletal Traction Type of Traction: Bucks Skin Traction Traction Location: rle Traction Weight: 15 lbs   Post Interventions Patient Tolerated: Well Instructions Provided: Other (comment)   Michelle Piper 09/19/2020, 2:07 PM

## 2020-09-19 NOTE — ED Notes (Signed)
Patient transported to CT scan . 

## 2020-09-19 NOTE — Transfer of Care (Signed)
Immediate Anesthesia Transfer of Care Note  Patient: Lawrence [name]  Procedure(s) Performed: INTRAMEDULLARY (IM) NAIL FEMORAL (Right )  Patient Location: PACU  Anesthesia Type:General  Level of Consciousness: awake, alert  and oriented  Airway & Oxygen Therapy: Patient Spontanous Breathing and Patient connected to face mask oxygen  Post-op Assessment: Report given to RN, Post -op Vital signs reviewed and stable and Patient moving all extremities X 4  Post vital signs: Reviewed and stable  Last Vitals:  Vitals Value Taken Time  BP 157/68 09/19/20 1802  Temp    Pulse 121 09/19/20 1806  Resp 22 09/19/20 1806  SpO2 96 % 09/19/20 1806  Vitals shown include unvalidated device data.  Last Pain:  Vitals:   09/19/20 1338  TempSrc:   PainSc: 2          Complications: No complications documented.

## 2020-09-19 NOTE — Anesthesia Preprocedure Evaluation (Addendum)
Anesthesia Evaluation  Patient identified by MRN, date of birth, ID band  Reviewed: Allergy & Precautions, Patient's Chart, lab work & pertinent test results  History of Anesthesia Complications Negative for: history of anesthetic complications  Airway Mallampati: II  TM Distance: >3 FB Neck ROM: Full  Mouth opening: Limited Mouth Opening Comment: Mouth opening limited by pain on right side of jaw Dental no notable dental hx. (+) Teeth Intact, Dental Advisory Given   Pulmonary Patient abstained from smoking.,    Pulmonary exam normal breath sounds clear to auscultation       Cardiovascular Normal cardiovascular exam Rhythm:Regular Rate:Normal     Neuro/Psych    GI/Hepatic   Endo/Other    Renal/GU      Musculoskeletal   Abdominal   Peds  Hematology   Anesthesia Other Findings   Reproductive/Obstetrics                           Anesthesia Physical Anesthesia Plan  ASA: II  Anesthesia Plan: General   Post-op Pain Management:    Induction: Intravenous  PONV Risk Score and Plan:   Airway Management Planned:   Additional Equipment:   Intra-op Plan:   Post-operative Plan: Extubation in OR  Informed Consent: I have reviewed the patients History and Physical, chart, labs and discussed the procedure including the risks, benefits and alternatives for the proposed anesthesia with the patient or authorized representative who has indicated his/her understanding and acceptance.     Dental advisory given  Plan Discussed with: CRNA  Anesthesia Plan Comments:         Anesthesia Quick Evaluation
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
## 2020-09-20 LAB — CBC
HCT: 30.3 % — ABNORMAL LOW (ref 39.0–52.0)
Hemoglobin: 9.5 g/dL — ABNORMAL LOW (ref 13.0–17.0)
MCH: 26.3 pg (ref 26.0–34.0)
MCHC: 31.4 g/dL (ref 30.0–36.0)
MCV: 83.9 fL (ref 80.0–100.0)
Platelets: 208 10*3/uL (ref 150–400)
RBC: 3.61 MIL/uL — ABNORMAL LOW (ref 4.22–5.81)
RDW: 14.6 % (ref 11.5–15.5)
WBC: 15 10*3/uL — ABNORMAL HIGH (ref 4.0–10.5)
nRBC: 0 % (ref 0.0–0.2)

## 2020-09-20 LAB — BASIC METABOLIC PANEL
Anion gap: 8 (ref 5–15)
BUN: 14 mg/dL (ref 6–20)
CO2: 24 mmol/L (ref 22–32)
Calcium: 8.3 mg/dL — ABNORMAL LOW (ref 8.9–10.3)
Chloride: 105 mmol/L (ref 98–111)
Creatinine, Ser: 1.38 mg/dL — ABNORMAL HIGH (ref 0.61–1.24)
GFR, Estimated: >60 mL/min (ref >60)
Glucose, Bld: 132 mg/dL — ABNORMAL HIGH (ref 70–99)
Potassium: 4.1 mmol/L (ref 3.5–5.1)
Sodium: 137 mmol/L (ref 135–145)

## 2020-09-20 LAB — VITAMIN D 25 HYDROXY (VIT D DEFICIENCY, FRACTURES): Vit D, 25-Hydroxy: 13.67 ng/mL — ABNORMAL LOW (ref 30–100)

## 2020-09-20 MED ORDER — VITAMIN D 25 MCG (1000 UNIT) PO TABS
2000.0000 [IU] | ORAL_TABLET | Freq: Two times a day (BID) | ORAL | Status: DC
  Administered 2020-09-20 – 2020-09-23 (×7): 2000 [IU] via ORAL
  Filled 2020-09-20 (×8): qty 2

## 2020-09-20 MED ORDER — CHLORHEXIDINE GLUCONATE CLOTH 2 % EX PADS
6.0000 | MEDICATED_PAD | Freq: Every day | CUTANEOUS | Status: DC
  Administered 2020-09-20 – 2020-09-23 (×3): 6 via TOPICAL

## 2020-09-20 MED ORDER — MUPIROCIN 2 % EX OINT
1.0000 "application " | TOPICAL_OINTMENT | Freq: Two times a day (BID) | CUTANEOUS | Status: DC
  Administered 2020-09-20 – 2020-09-23 (×7): 1 via NASAL
  Filled 2020-09-20 (×2): qty 22

## 2020-09-20 NOTE — Plan of Care (Signed)
  Problem: Education: Goal: Knowledge of General Education information will improve Description: Including pain rating scale, medication(s)/side effects and non-pharmacologic comfort measures Outcome: Progressing   Problem: Health Behavior/Discharge Planning: Goal: Ability to manage health-related needs will improve Outcome: Progressing   Problem: Clinical Measurements: Goal: Ability to maintain clinical measurements within normal limits will improve Outcome: Progressing Goal: Will remain free from infection Outcome: Progressing Goal: Diagnostic test results will improve Outcome: Progressing   Problem: Education: Goal: Knowledge of General Education information will improve Description: Including pain rating scale, medication(s)/side effects and non-pharmacologic comfort measures Outcome: Progressing   Problem: Health Behavior/Discharge Planning: Goal: Ability to manage health-related needs will improve Outcome: Progressing   Problem: Clinical Measurements: Goal: Ability to maintain clinical measurements within normal limits will improve Outcome: Progressing Goal: Will remain free from infection Outcome: Progressing Goal: Diagnostic test results will improve Outcome: Progressing   

## 2020-09-20 NOTE — Plan of Care (Signed)
  Problem: Education: Goal: Knowledge of General Education information will improve Description Including pain rating scale, medication(s)/side effects and non-pharmacologic comfort measures Outcome: Progressing   Problem: Health Behavior/Discharge Planning: Goal: Ability to manage health-related needs will improve Outcome: Progressing   

## 2020-09-20 NOTE — Anesthesia Postprocedure Evaluation (Signed)
Anesthesia Post Note  Patient: Lawrence [name]  Procedure(s) Performed: INTRAMEDULLARY (IM) NAIL FEMORAL (Right )     Patient location during evaluation: PACU Anesthesia Type: General Level of consciousness: awake and alert Pain management: pain level controlled Vital Signs Assessment: post-procedure vital signs reviewed and stable Respiratory status: spontaneous breathing, nonlabored ventilation and respiratory function stable Cardiovascular status: blood pressure returned to baseline and stable Postop Assessment: no apparent nausea or vomiting Anesthetic complications: no   No complications documented.  Last Vitals:  Vitals:   09/20/20 0019 09/20/20 0317  BP: 134/72 (!) 146/75  Pulse: (!) 105 94  Resp: 17 16  Temp: 37.5 C 37.7 C  SpO2: 97% 98%    Last Pain:  Vitals:   09/20/20 0317  TempSrc: Oral  PainSc:                  Lowella Curb
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
## 2020-09-20 NOTE — Progress Notes (Signed)
Subjective: 1 Day Post-Op Procedure(s) (LRB): INTRAMEDULLARY (IM) NAIL FEMORAL (Right) Patient reports pain as mild and moderate.    Objective: Vital signs in last 24 hours: Temp:  [97.1 F (36.2 C)-99.8 F (37.7 C)] 97.8 F (36.6 C) (05/07 1018) Pulse Rate:  [82-105] 82 (05/07 1018) Resp:  [16-22] 18 (05/07 1018) BP: (131-157)/(68-90) 152/68 (05/07 1018) SpO2:  [94 %-98 %] 95 % (05/07 1018)  Intake/Output from previous day: 05/06 0701 - 05/07 0700 In: 3666.2 [I.V.:2816.2; IV Piggyback:850] Out: 1100 [Urine:1050; Blood:50] Intake/Output this shift: Total I/O In: 240 [P.O.:240] Out: 250 [Urine:250]  Recent Labs    09/19/20 0637 09/19/20 0646 09/19/20 1621 09/20/20 0106  HGB 13.5 15.0 12.2* 9.5*   Recent Labs    09/19/20 0637 09/19/20 0646 09/19/20 1621 09/20/20 0106  WBC 10.3  --   --  15.0*  RBC 5.09  --   --  3.61*  HCT 44.4   < > 36.0* 30.3*  PLT 286  --   --  208   < > = values in this interval not displayed.   Recent Labs    09/19/20 0637 09/19/20 0646 09/19/20 1621 09/20/20 0106  NA 138   < > 142 137  K 3.7   < > 4.3 4.1  CL 109   < > 104 105  CO2 22  --   --  24  BUN 12   < > 14 14  CREATININE 1.30*   < > 1.30* 1.38*  GLUCOSE 122*   < > 130* 132*  CALCIUM 8.8*  --   --  8.3*   < > = values in this interval not displayed.   Recent Labs    09/19/20 0637  INR 1.1    Neurovascular intact Sensation intact distally Intact pulses distally Dorsiflexion/Plantar flexion intact Incision: dressing C/D/I   Assessment/Plan: 1 Day Post-Op Procedure(s) (LRB): INTRAMEDULLARY (IM) NAIL FEMORAL (Right) Up with therapy WBAT RLE Pain control as ordered dsg change tomorrow lovenox dvt proph       Margart Sickles 09/20/2020, 12:42 PM
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
## 2020-09-20 NOTE — Progress Notes (Signed)
Contact TRN, Katie to request assessment of patient.  Family states that patient is concerned abut mild discomfort when patient eats or drinks, swelling around site of ear injury, and bruise on head.  Will sit patient in recliner for next meal to see if this improves discomfort from eating in bed.  TRN will assess patient this evening.Marland Kitchen

## 2020-09-21 DIAGNOSIS — S08129A Partial traumatic amputation of unspecified ear, initial encounter: Secondary | ICD-10-CM

## 2020-09-21 DIAGNOSIS — S7291XA Unspecified fracture of right femur, initial encounter for closed fracture: Secondary | ICD-10-CM

## 2020-09-21 LAB — BASIC METABOLIC PANEL
Anion gap: 8 (ref 5–15)
BUN: 10 mg/dL (ref 6–20)
CO2: 22 mmol/L (ref 22–32)
Calcium: 8.3 mg/dL — ABNORMAL LOW (ref 8.9–10.3)
Chloride: 106 mmol/L (ref 98–111)
Creatinine, Ser: 1.22 mg/dL (ref 0.61–1.24)
GFR, Estimated: >60 mL/min (ref >60)
Glucose, Bld: 106 mg/dL — ABNORMAL HIGH (ref 70–99)
Potassium: 3.9 mmol/L (ref 3.5–5.1)
Sodium: 136 mmol/L (ref 135–145)

## 2020-09-21 LAB — CBC
HCT: 26.5 % — ABNORMAL LOW (ref 39.0–52.0)
Hemoglobin: 8.4 g/dL — ABNORMAL LOW (ref 13.0–17.0)
MCH: 26.8 pg (ref 26.0–34.0)
MCHC: 31.7 g/dL (ref 30.0–36.0)
MCV: 84.7 fL (ref 80.0–100.0)
Platelets: 165 10*3/uL (ref 150–400)
RBC: 3.13 MIL/uL — ABNORMAL LOW (ref 4.22–5.81)
RDW: 14.3 % (ref 11.5–15.5)
WBC: 12.4 10*3/uL — ABNORMAL HIGH (ref 4.0–10.5)
nRBC: 0 % (ref 0.0–0.2)

## 2020-09-21 NOTE — Progress Notes (Signed)
   09/21/20 1118  OTHER  Substance Abuse Education Offered No (Patient declined any substance use outside of a drink once a month or so when going out to eat)  (CAGE-AID) Substance Abuse Screening Tool  Have You Ever Felt You Ought to Cut Down on Your Drinking or Drug Use? 0  Have People Annoyed You By Critizing Your Drinking Or Drug Use? 0  Have You Felt Bad Or Guilty About Your Drinking Or Drug Use? 0  Have You Ever Had a Drink or Used Drugs First Thing In The Morning to Steady Your Nerves or to Get Rid of a Hangover? 0  CAGE-AID Score 0
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
## 2020-09-21 NOTE — Plan of Care (Addendum)
Pt has episodes of forgetfulness.  Problem: Education: Goal: Knowledge of General Education information will improve Description: Including pain rating scale, medication(s)/side effects and non-pharmacologic comfort measures Outcome: Progressing   Problem: Health Behavior/Discharge Planning: Goal: Ability to manage health-related needs will improve Outcome: Progressing   Problem: Clinical Measurements: Goal: Ability to maintain clinical measurements within normal limits will improve Outcome: Progressing   Problem: Activity: Goal: Risk for activity intolerance will decrease Outcome: Progressing   Problem: Nutrition: Goal: Adequate nutrition will be maintained Outcome: Progressing   Problem: Coping: Goal: Level of anxiety will decrease Outcome: Progressing   Problem: Elimination: Goal: Will not experience complications related to bowel motility Outcome: Progressing   Problem: Pain Managment: Goal: General experience of comfort will improve Outcome: Progressing   Problem: Safety: Goal: Ability to remain free from injury will improve Outcome: Progressing   Problem: Skin Integrity: Goal: Risk for impaired skin integrity will decrease Outcome: Progressing
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
## 2020-09-22 ENCOUNTER — Encounter (HOSPITAL_COMMUNITY): Payer: Self-pay | Admitting: Student

## 2020-09-22 LAB — BASIC METABOLIC PANEL
Anion gap: 9 (ref 5–15)
BUN: 9 mg/dL (ref 6–20)
CO2: 20 mmol/L — ABNORMAL LOW (ref 22–32)
Calcium: 8.2 mg/dL — ABNORMAL LOW (ref 8.9–10.3)
Chloride: 106 mmol/L (ref 98–111)
Creatinine, Ser: 1.11 mg/dL (ref 0.61–1.24)
GFR, Estimated: >60 mL/min (ref >60)
Glucose, Bld: 120 mg/dL — ABNORMAL HIGH (ref 70–99)
Potassium: 3.9 mmol/L (ref 3.5–5.1)
Sodium: 135 mmol/L (ref 135–145)

## 2020-09-22 MED ORDER — METHOCARBAMOL 500 MG PO TABS: 500.0000 mg | ORAL_TABLET | Freq: Four times a day (QID) | ORAL | 0 refills | Status: DC | PRN

## 2020-09-22 MED ORDER — ENOXAPARIN SODIUM 30 MG/0.3ML IJ SOSY: 30.0000 mg | PREFILLED_SYRINGE | Freq: Two times a day (BID) | INTRAMUSCULAR | 0 refills | Status: DC

## 2020-09-22 MED ORDER — VITAMIN D3 25 MCG PO TABS: 2000.0000 [IU] | ORAL_TABLET | Freq: Two times a day (BID) | ORAL | 2 refills | Status: AC

## 2020-09-22 MED ORDER — OXYCODONE HCL 5 MG PO TABS: 5.0000 mg | ORAL_TABLET | ORAL | 0 refills | Status: DC | PRN

## 2020-09-22 MED ORDER — DOCUSATE SODIUM 100 MG PO CAPS: 100.0000 mg | ORAL_CAPSULE | Freq: Two times a day (BID) | ORAL | 0 refills | Status: DC

## 2020-09-22 NOTE — Discharge Instructions (Addendum)
[facility] Discharge Instructions   General Discharge Instructions  WEIGHT BEARING STATUS: Weightbearing as tolerated right lower extremity  RANGE OF MOTION/ACTIVITY: Ok for hip and knee ROM as tolerated  Wound Care: Incisions can be left open to air if there is no drainage. If incision continues to have drainage, follow wound care instructions below. Okay to shower if no drainage from incisions.  DVT/PE prophylaxis: Lovenox x 30 days  Diet: as you were eating previously.  Can use over the counter stool softeners and bowel preparations, such as Miralax, to help with bowel movements.  Narcotics can be constipating.  Be sure to drink plenty of fluids  PAIN MEDICATION USE AND EXPECTATIONS  You have likely been given narcotic medications to help control your pain.  After a traumatic event that results in an fracture (broken bone) with or without surgery, it is ok to use narcotic pain medications to help control one's pain.  We understand that everyone responds to pain differently and each individual patient will be evaluated on a regular basis for the continued need for narcotic medications. Ideally, narcotic medication use should last no more than 6-8 weeks (coinciding with fracture healing).   As a patient it is your responsibility as well to monitor narcotic medication use and report the amount and frequency you use these medications when you come to your office visit.   We would also advise that if you are using narcotic medications, you should take a dose prior to therapy to maximize you participation.  IF YOU ARE ON NARCOTIC MEDICATIONS IT IS NOT PERMISSIBLE TO OPERATE A MOTOR VEHICLE (MOTORCYCLE/CAR/TRUCK/MOPED) OR HEAVY MACHINERY DO NOT MIX NARCOTICS WITH OTHER CNS (CENTRAL NERVOUS SYSTEM) DEPRESSANTS SUCH AS ALCOHOL   STOP SMOKING OR USING NICOTINE PRODUCTS!!!!  As discussed nicotine severely impairs your body's ability to heal surgical and traumatic wounds but also  impairs bone healing.  Wounds and bone heal by forming microscopic blood vessels (angiogenesis) and nicotine is a vasoconstrictor (essentially, shrinks blood vessels).  Therefore, if vasoconstriction occurs to these microscopic blood vessels they essentially disappear and are unable to deliver necessary nutrients to the healing tissue.  This is one modifiable factor that you can do to dramatically increase your chances of healing your injury.    (This means no smoking, no nicotine gum, patches, etc)  DO NOT USE NONSTEROIDAL ANTI-INFLAMMATORY DRUGS (NSAID'S)  Using products such as Advil (ibuprofen), Aleve (naproxen), Motrin (ibuprofen) for additional pain control during fracture healing can delay and/or prevent the healing response.  If you would like to take over the counter (OTC) medication, Tylenol (acetaminophen) is ok.  However, some narcotic medications that are given for pain control contain acetaminophen as well. Therefore, you should not exceed more than 4000 mg of tylenol in a day if you do not have liver disease.  Also note that there are may OTC medicines, such as cold medicines and allergy medicines that my contain tylenol as well.  If you have any questions about medications and/or interactions please ask your doctor/PA or your pharmacist.      ICE AND ELEVATE INJURED/OPERATIVE EXTREMITY  Using ice and elevating the injured extremity above your heart can help with swelling and pain control.  Icing in a pulsatile fashion, such as 20 minutes on and 20 minutes off, can be followed.    Do not place ice directly on skin. Make sure there is a barrier between to skin and the ice pack.    Using frozen items such as  frozen peas works well as the conform nicely to the are that needs to be iced.  USE AN ACE WRAP OR TED HOSE FOR SWELLING CONTROL  In addition to icing and elevation, Ace wraps or TED hose are used to help limit and resolve swelling.  It is recommended to use Ace wraps or TED hose until  you are informed to stop.    When using Ace Wraps start the wrapping distally (farthest away from the body) and wrap proximally (closer to the body)   Example: If you had surgery on your leg or thing and you do not have a splint on, start the ace wrap at the toes and work your way up to the thigh        If you had surgery on your upper extremity and do not have a splint on, start the ace wrap at your fingers and work your way up to the upper arm   CALL THE OFFICE WITH ANY QUESTIONS OR CONCERNS: [phone]   VISIT OUR WEBSITE FOR ADDITIONAL INFORMATION: [email or web address]    Discharge Wound Care Instructions  Do NOT apply any ointments, solutions or lotions to pin sites or surgical wounds.  These prevent needed drainage and even though solutions like hydrogen peroxide kill bacteria, they also damage cells lining the pin sites that help fight infection.  Applying lotions or ointments can keep the wounds moist and can cause them to breakdown and open up as well. This can increase the risk for infection. When in doubt call the office.  If any drainage is noted, use foam dressings.  Once the incision is completely dry and without drainage, it may be left open to air out.  Showering may begin 36-48 hours later.  Cleaning gently with soap and water.    Laceration Care, Adult A laceration is a cut that may go through all layers of the skin. The cut may also go into the tissue that is right under the skin. Some cuts heal on their own. Others need to be closed with stitches (sutures), staples, skin adhesive strips, or skin glue. Taking care of your injury lowers your risk of infection, helps your injury to heal better, and may prevent scarring. Supplies needed:  Soap.  Water.  Hand sanitizer.  Bandage (dressing).  Antibiotic ointment.  Clean towel. How to take care of your cut Wash your hands with soap and water before touching your wound or changing your bandage. If soap and water are  not available, use hand sanitizer. If your doctor used stitches or staples:  Keep the wound clean and dry.  If you were given a bandage, change it at least once a day as told by your doctor. You should also change it if it gets wet or dirty.  Keep the wound completely dry for the first 24 hours, or as told by your doctor. After that, you may take a shower or a bath. Do not get the wound soaked in water until after the stitches or staples have been removed.  Clean the wound once a day, or as told by your doctor: ? Wash the wound with soap and water. ? Rinse the wound with water to remove all soap. ? Pat the wound dry with a clean towel. Do not rub the wound.  After you clean the wound, put a thin layer of antibiotic ointment on it as told by your doctor. This ointment: ? Helps to prevent infection. ? Keeps the bandage from sticking to the wound.  Have your stitches or staples removed as told by your doctor. If your doctor used skin adhesive strips:  Keep the wound clean and dry.  If you were given a bandage, you should change it at least once a day as told by your doctor. You should also change it if it gets wet or dirty.  Do not get the skin adhesive strips wet. You can take a shower or a bath, but keep the wound dry.  If the wound gets wet, pat it dry with a clean towel. Do not rub the wound.  Skin adhesive strips fall off on their own. You can trim the strips as the wound heals. Do not remove any strips that are still stuck to the wound. They will fall off after a while. If your doctor used skin glue:  Try to keep your wound dry, but you may briefly wet it in the shower or bath. Do not soak the wound in water, such as by swimming.  After you take a shower or a bath, gently pat the wound dry with a clean towel. Do not rub the wound.  Do not do any activities that will make you really sweaty until the skin glue has fallen off on its own.  Do not apply liquid, cream, or ointment  medicine to your wound while the skin glue is still on.  If you were given a bandage, you should change it at least once a day or as told by your doctor. You should also change it if it gets dirty or wet.  If a bandage is placed over the wound, do not let the tape touch the skin glue.  Do not pick at the glue. The skin glue usually stays on for 5-10 days. Then, it falls off the skin. General instructions  Take over-the-counter and prescription medicines only as told by your doctor.  If you were given antibiotic medicine or ointment, take or apply it as told by your doctor. Do not stop using it even if your condition improves.  Do not scratch or pick at the wound.  Check your wound every day for signs of infection. Watch for: ? Redness, swelling, or pain. ? Fluid, blood, or pus.  Raise (elevate) the injured area above the level of your heart while you are sitting or lying down.  If directed, put ice on the affected area: ? Put ice in a plastic bag. ? Place a towel between your skin and the bag. ? Leave the ice on for 20 minutes, 2-3 times a day.  Prevent scarring by covering your wound with sunscreen of at least 30 SPF whenever you are outside after your wound has healed.  Keep all follow-up visits as told by your doctor. This is important.   Get help if:  You got a tetanus shot and you have any of these problems at the injection site: ? Swelling. ? Very bad pain. ? Redness. ? Bleeding.  You have a fever.  A wound that was closed breaks open.  You notice a bad smell coming from your wound or your bandage.  You notice something coming out of the wound, such as wood or glass.  Medicine does not relieve your pain.  You have more redness, swelling, or pain at the site of your wound.  You have fluid, blood, or pus coming from your wound.  You notice a change in the color of your skin near your wound.  You need to change the bandage often because  fluid, blood, or pus is  coming from the wound.  You start to have a new rash.  You start to have numbness around the wound. Get help right away if:  You have very bad swelling around the wound.  Your pain suddenly gets worse and is very bad.  You notice painful lumps near the wound or anywhere on your body.  You have a red streak going away from your wound.  The wound is on your hand or foot, and: ? You cannot move a finger or toe. ? Your fingers or toes look pale or bluish. Summary  A laceration is a cut that may go through all layers of the skin. The cut may also go into the tissue right under the skin.  Some cuts heal on their own. Others need to be closed with stitches, staples, skin adhesive strips, or skin glue.  Follow your doctor's instructions for caring for your cut. Proper care of a cut lowers the risk of infection, helps the cut heal better, and prevents scarring. This information is not intended to replace advice given to you by your health care provider. Make sure you discuss any questions you have with your health care provider. Document Revised: 07/01/2017 Document Reviewed: 05/23/2017 Elsevier Patient Education  2021 [employer].

## 2020-09-22 NOTE — Progress Notes (Signed)
   09/22/20 1555  Clinical Encounter Type  Visited With Other (Comment)  Visit Type Initial  Referral From Nurse  Consult/Referral To Chaplain  Nurse, [place] called inquiring about the patient's mother. She wants the Chaplain to notarize a document authorizing her to be to get the patient's belongings out of his car. This chaplain advised we only notarize HCPOA. Nurse, [place] stated she would inform the patient's mother. This note was prepared by Deneen Harts, M.Div..  For questions please contact by phone [phone].
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
## 2020-09-22 NOTE — Progress Notes (Signed)
ORTHO TRAUMA PROGRESS NOTE  Subjective: 3 Days Post-Op Procedure(s) (LRB): INTRAMEDULLARY (IM) NAIL FEMORAL (Right) Patient reports pain as mild. Was able to move around with therapies over the weekend without significant pain. Mother at bedside.  Objective: Vital signs in last 24 hours: Temp:  [98.2 F (36.8 C)-99.4 F (37.4 C)] 98.2 F (36.8 C) (05/09 0635) Pulse Rate:  [78-93] 78 (05/09 0635) Resp:  [15-19] 16 (05/09 0635) BP: (145-159)/(79-87) 159/87 (05/09 0635) SpO2:  [95 %-100 %] 100 % (05/09 0635)  Intake/Output from previous day: 05/08 0701 - 05/09 0700 In: 3645.6 [P.O.:560; I.V.:3085.6] Out: 2580 [Urine:2580] Intake/Output this shift: No intake/output data recorded.  Recent Labs    09/19/20 1621 09/20/20 0106 09/21/20 0225  HGB 12.2* 9.5* 8.4*   Recent Labs    09/20/20 0106 09/21/20 0225  WBC 15.0* 12.4*  RBC 3.61* 3.13*  HCT 30.3* 26.5*  PLT 208 165   Recent Labs    09/21/20 0225 09/22/20 0243  NA 136 135  K 3.9 3.9  CL 106 106  CO2 22 20*  BUN 10 9  CREATININE 1.22 1.11  GLUCOSE 106* 120*  CALCIUM 8.3* 8.2*   No results for input(s): LABPT, INR in the last 72 hours.  Neurovascular intact Sensation intact distally Intact pulses distally Dorsiflexion/Plantar flexion intact Incision: dressing C/D/I   Assessment/Plan: 3 Days Post-Op Procedure(s) (LRB): INTRAMEDULLARY (IM) NAIL FEMORAL (Right) Up with therapy WBAT RLE Pain control as ordered Okay to leave incisions open to air. Reinforce as needed. lovenox dvt proph Patient ok for d/c from ortho standpoint once cleared by trauma team and therapies. Follow-up 2 weeks after d/c with Dr. Jena Gauss for repeat x-rays and wound check. I have sent all patient's prescriptions to pharmacy of choice   Cherisse Carrell A. Ladonna Snide [facility] [phone] (office) [email or web address]
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
## 2020-09-23 ENCOUNTER — Inpatient Hospital Stay (HOSPITAL_COMMUNITY): Payer: 59

## 2020-09-23 MED ORDER — POLYETHYLENE GLYCOL 3350 17 G PO PACK: 17.0000 g | PACK | Freq: Every day | ORAL | 0 refills | Status: DC | PRN

## 2020-09-23 MED ORDER — DOCUSATE SODIUM 100 MG PO CAPS: 100.0000 mg | ORAL_CAPSULE | Freq: Every day | ORAL | 0 refills | Status: DC | PRN

## 2020-09-23 MED ORDER — SODIUM CHLORIDE 0.9 % IV BOLUS
500.0000 mL | Freq: Once | INTRAVENOUS | Status: AC
  Administered 2020-09-23: 500 mL via INTRAVENOUS

## 2020-09-23 NOTE — TOC Transition Note (Signed)
Transition of Care [facility]) - CM/SW Discharge Note   Patient Details  Name: DETRON CARRAS MRN: [id number] Date of Birth: 11-22-1975  Transition [facility] Care [facility]) CM/SW Contact:  Mearl Latin, LCSW Phone Number: 09/23/2020, 2:00 PM   Clinical Narrative:    Patient agreeable to outpatient therapy since home health services are unable to be obtained and he prefers to discharge home rather than CIR. CSW sent referral to [facility]. Patient has all DME. No other needs identified at this time.    Final next level of care: OP Rehab Barriers to Discharge: Barriers Resolved   Patient Goals and CMS Choice Patient states their goals for this hospitalization and ongoing recovery are:: Pt is agreeable to adapt for equiptment and had no preference for [facility]. CMS Medicare.gov Compare Post Acute Care list provided to:: Patient Choice offered to / list presented to : Patient  Discharge Placement                       Discharge Plan and Services In-house Referral: Clinical Social Work   Post Acute Care Choice: Home Health,Durable Medical Equipment          DME Arranged: Wheelchair manual,Tub bench,Walker rolling,Bedside commode                    Social Determinants of Health (SDOH) Interventions     Readmission Risk Interventions No flowsheet data found.

## 2020-09-23 NOTE — Progress Notes (Signed)
Patient discharged to home with transportation provided by family member.  Patient was given AVS prior to discharge with material reviewed with primary RN concerning changes to medications and follow up appointments with no questions/concerns voiced by the patient at this time.  Patient was discharged with all DME equipment which included 3 in 1 bedside commode, manual wheelchair with seat cushion, tub bench and rolling walker.  Patient's PIV was removed prior to discharge with patient transported off the unit by this RN and assisted into family member's vehicle for transport to home.
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
## 2020-09-23 NOTE — Progress Notes (Signed)
Inpatient Rehab Admissions Coordinator:   Consult received.  I spoke to the patient over the phone and he prefers to discharge home with outpatient follow up.  I let Carl Best, PA-C, know.  CIR will sign off at this time.   Estill Dooms, PT, DPT Admissions Coordinator [phone] 09/23/20  1:41 PM
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
## 2020-09-30 ENCOUNTER — Ambulatory Visit: Payer: 59 | Attending: Student

## 2020-09-30 ENCOUNTER — Other Ambulatory Visit: Payer: Self-pay

## 2020-09-30 DIAGNOSIS — R2689 Other abnormalities of gait and mobility: Secondary | ICD-10-CM | POA: Insufficient documentation

## 2020-09-30 DIAGNOSIS — R262 Difficulty in walking, not elsewhere classified: Secondary | ICD-10-CM | POA: Insufficient documentation

## 2020-09-30 DIAGNOSIS — M79651 Pain in right thigh: Secondary | ICD-10-CM | POA: Diagnosis present

## 2020-09-30 DIAGNOSIS — M6281 Muscle weakness (generalized): Secondary | ICD-10-CM
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
## 2020-09-30 NOTE — Patient Instructions (Signed)
Access Code: [email or web address] URL: [email or web address] Date: 09/30/2020 Prepared by: Ria Comment  Exercises Supine Quadricep Sets - 3 x daily - 7 x weekly - 2 sets - 10 reps - 5s hold Supine Gluteal Sets - 3 x daily - 7 x weekly - 2 sets - 10 reps - 5s hold Supine Heel Slide with Strap (Mirrored) - 3 x daily - 7 x weekly - 2 sets - 10 reps - 5s hold Supine Hip Abduction (Mirrored) - 3 x daily - 7 x weekly - 2 sets - 10 reps - 5s hold Seated Knee Flexion Stretch - 3 x daily - 7 x weekly - 2 minutes progressing for longer and longer time hold

## 2020-10-01 NOTE — Patient Instructions (Addendum)
Next visit: pt to complete LEFS, measure R knee flexion ROM, TUG, 85m gait speed, review HEP, progress R knee ROM, mat table strengthening

## 2020-10-01 NOTE — Addendum Note (Signed)
Addended by: Ria Comment D on: 10/01/2020 04:24 PM   Modules accepted: Orders

## 2020-10-02 ENCOUNTER — Ambulatory Visit: Payer: 59

## 2020-10-02 ENCOUNTER — Other Ambulatory Visit: Payer: Self-pay

## 2020-10-02 DIAGNOSIS — M79651 Pain in right thigh: Secondary | ICD-10-CM

## 2020-10-02 DIAGNOSIS — M6281 Muscle weakness (generalized): Secondary | ICD-10-CM
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
## 2020-10-06 NOTE — Patient Instructions (Incomplete)
TREATMENT   Ther-ex  Supine bilateral quad/glut sets with 3s hold x 10; Supine R straight leg hip abduction2x 10, with manual resistance;  Supine R straight leg hip adduction2x 10, with manual resistance; SupineR heel slides with resisted extension 2 x 10; Hooklying R SAQ over bolster 2 x 10BLE, with manual resistance; Hooklying clams 2x 10 BLE, with manual resistance; Hooklyingadductorsqueeze2x 10 BLE, with manual resistance; Hooklying bolster bridges with arms across chest 2 x 10; Hooklying marching x 10BLE;   Pt educated throughout session about proper posture and technique with exercises. Improved exercise technique, movement at target joints, use of target muscles after min to mod verbal, visual, tactile cues.    Pt demonstrates excellent motivation during session today. He is already demonstrating improved activation of R quad as well as knee flexion range of motion. Initiated mat table strengthening today. Pt demonstrate R knee extensor lag during SAQ. Updated additional outcome measures with patient today. His TUG is 23.8s and 34m gait speed is 25.6s. He is able to briefly shift approximately 120# to his RLE in standing as measured on floor scale. Pt encouraged to continue current HEP and and no modifications made on this date. Pt will benefit from PT services to address deficits in strength, pain, and mobility in order to return to full function at home.

## 2020-10-07 ENCOUNTER — Other Ambulatory Visit: Payer: Self-pay

## 2020-10-07 ENCOUNTER — Ambulatory Visit: Payer: 59

## 2020-10-07 DIAGNOSIS — M6281 Muscle weakness (generalized): Secondary | ICD-10-CM

## 2020-10-07 DIAGNOSIS — M79651 Pain in right thigh: Secondary | ICD-10-CM

## 2020-10-07 DIAGNOSIS — R262 Difficulty in walking, not elsewhere classified: Secondary | ICD-10-CM
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
## 2020-10-08 NOTE — Patient Instructions (Addendum)
Access Code: [email or web address] URL: [email or web address] Date: 10/10/2020 Prepared by: Ria Comment  Exercises Supine Heel Slide with Strap (Mirrored) - 3 x daily - 7 x weekly - 2 sets - 10 reps - 5s hold Seated Knee Flexion Stretch - 3 x daily - 7 x weekly - 2 minutes progressing for longer and longer time hold Seated Hip Abduction with Resistance - 2 x daily - 7 x weekly - 2 sets - 10 reps - 5s hold Seated Hip Adduction Isometrics with Ball - 2 x daily - 7 x weekly - 2 sets - 10 reps - 5s hold Seated Long Arc Quad - 2 x daily - 7 x weekly - 2 sets - 10 reps - 5s hold Mini Squats with Walker and Chair - 2 x daily - 7 x weekly - 2 sets - 10 reps Heel Raises with Walker and Chair - 2 x daily - 7 x weekly - 2 sets - 10 reps - 5s hold

## 2020-10-09 ENCOUNTER — Ambulatory Visit (INDEPENDENT_AMBULATORY_CARE_PROVIDER_SITE_OTHER): Payer: 59 | Admitting: Plastic Surgery

## 2020-10-09 ENCOUNTER — Other Ambulatory Visit: Payer: Self-pay

## 2020-10-09 DIAGNOSIS — S08121A Partial traumatic amputation of right ear, initial encounter: Secondary | ICD-10-CM | POA: Diagnosis not present
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
## 2020-10-10 ENCOUNTER — Ambulatory Visit: Payer: 59

## 2020-10-10 DIAGNOSIS — M79651 Pain in right thigh: Secondary | ICD-10-CM | POA: Diagnosis not present

## 2020-10-10 DIAGNOSIS — M6281 Muscle weakness (generalized): Secondary | ICD-10-CM
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
## 2020-10-16 ENCOUNTER — Other Ambulatory Visit: Payer: Self-pay

## 2020-10-16 ENCOUNTER — Ambulatory Visit: Payer: 59 | Attending: Student

## 2020-10-16 DIAGNOSIS — M6281 Muscle weakness (generalized): Secondary | ICD-10-CM | POA: Diagnosis present

## 2020-10-16 DIAGNOSIS — M79651 Pain in right thigh: Secondary | ICD-10-CM | POA: Diagnosis present

## 2020-10-16 DIAGNOSIS — R262 Difficulty in walking, not elsewhere classified: Secondary | ICD-10-CM | POA: Insufficient documentation

## 2020-10-16 DIAGNOSIS — R2689 Other abnormalities of gait and mobility: Secondary | ICD-10-CM | POA: Insufficient documentation
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
## 2020-10-21 ENCOUNTER — Ambulatory Visit: Payer: 59

## 2020-10-21 ENCOUNTER — Other Ambulatory Visit: Payer: Self-pay

## 2020-10-21 DIAGNOSIS — M79651 Pain in right thigh: Secondary | ICD-10-CM | POA: Diagnosis not present

## 2020-10-21 DIAGNOSIS — M6281 Muscle weakness (generalized): Secondary | ICD-10-CM
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
## 2020-10-23 ENCOUNTER — Ambulatory Visit: Payer: 59 | Admitting: Physical Therapy

## 2020-10-23 ENCOUNTER — Other Ambulatory Visit: Payer: Self-pay

## 2020-10-23 DIAGNOSIS — M79651 Pain in right thigh: Secondary | ICD-10-CM | POA: Diagnosis not present

## 2020-10-23 DIAGNOSIS — R2689 Other abnormalities of gait and mobility: Secondary | ICD-10-CM

## 2020-10-23 DIAGNOSIS — M6281 Muscle weakness (generalized): Secondary | ICD-10-CM

## 2020-10-23 DIAGNOSIS — R262 Difficulty in walking, not elsewhere classified: Secondary | ICD-10-CM
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
## 2020-10-28 ENCOUNTER — Other Ambulatory Visit: Payer: Self-pay

## 2020-10-28 ENCOUNTER — Ambulatory Visit: Payer: 59

## 2020-10-28 DIAGNOSIS — M79651 Pain in right thigh: Secondary | ICD-10-CM

## 2020-10-28 DIAGNOSIS — M6281 Muscle weakness (generalized): Secondary | ICD-10-CM

## 2020-10-28 DIAGNOSIS — R262 Difficulty in walking, not elsewhere classified: Secondary | ICD-10-CM
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
## 2020-10-30 ENCOUNTER — Other Ambulatory Visit: Payer: Self-pay

## 2020-10-30 ENCOUNTER — Ambulatory Visit: Payer: 59

## 2020-10-30 DIAGNOSIS — M6281 Muscle weakness (generalized): Secondary | ICD-10-CM

## 2020-10-30 DIAGNOSIS — M79651 Pain in right thigh: Secondary | ICD-10-CM

## 2020-10-30 DIAGNOSIS — R262 Difficulty in walking, not elsewhere classified: Secondary | ICD-10-CM
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
## 2020-11-04 ENCOUNTER — Other Ambulatory Visit: Payer: Self-pay

## 2020-11-04 ENCOUNTER — Ambulatory Visit: Payer: 59

## 2020-11-04 DIAGNOSIS — M6281 Muscle weakness (generalized): Secondary | ICD-10-CM

## 2020-11-04 DIAGNOSIS — M79651 Pain in right thigh: Secondary | ICD-10-CM
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
## 2020-11-06 ENCOUNTER — Other Ambulatory Visit: Payer: Self-pay

## 2020-11-06 ENCOUNTER — Ambulatory Visit: Payer: 59

## 2020-11-06 DIAGNOSIS — M79651 Pain in right thigh: Secondary | ICD-10-CM | POA: Diagnosis not present

## 2020-11-06 DIAGNOSIS — M6281 Muscle weakness (generalized): Secondary | ICD-10-CM
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
## 2020-11-11 ENCOUNTER — Ambulatory Visit: Payer: 59 | Admitting: Physical Therapy

## 2020-11-11 ENCOUNTER — Other Ambulatory Visit: Payer: Self-pay

## 2020-11-11 DIAGNOSIS — M79651 Pain in right thigh: Secondary | ICD-10-CM | POA: Diagnosis not present

## 2020-11-11 DIAGNOSIS — R262 Difficulty in walking, not elsewhere classified: Secondary | ICD-10-CM

## 2020-11-11 DIAGNOSIS — M6281 Muscle weakness (generalized): Secondary | ICD-10-CM
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
## 2020-11-13 ENCOUNTER — Other Ambulatory Visit: Payer: Self-pay

## 2020-11-13 ENCOUNTER — Ambulatory Visit: Payer: 59 | Admitting: Physical Therapy

## 2020-11-13 ENCOUNTER — Ambulatory Visit (INDEPENDENT_AMBULATORY_CARE_PROVIDER_SITE_OTHER): Payer: 59 | Admitting: Plastic Surgery

## 2020-11-13 DIAGNOSIS — R2689 Other abnormalities of gait and mobility: Secondary | ICD-10-CM

## 2020-11-13 DIAGNOSIS — M79651 Pain in right thigh: Secondary | ICD-10-CM | POA: Diagnosis not present

## 2020-11-13 DIAGNOSIS — R262 Difficulty in walking, not elsewhere classified: Secondary | ICD-10-CM

## 2020-11-13 DIAGNOSIS — M6281 Muscle weakness (generalized): Secondary | ICD-10-CM

## 2020-11-13 DIAGNOSIS — S08121A Partial traumatic amputation of right ear, initial encounter: Secondary | ICD-10-CM
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
## 2020-11-19 ENCOUNTER — Ambulatory Visit: Payer: 59 | Attending: Student

## 2020-11-19 ENCOUNTER — Other Ambulatory Visit: Payer: Self-pay

## 2020-11-19 DIAGNOSIS — M79651 Pain in right thigh: Secondary | ICD-10-CM | POA: Diagnosis present

## 2020-11-19 DIAGNOSIS — R262 Difficulty in walking, not elsewhere classified: Secondary | ICD-10-CM | POA: Diagnosis present

## 2020-11-19 DIAGNOSIS — M6281 Muscle weakness (generalized): Secondary | ICD-10-CM | POA: Diagnosis present
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
## 2020-11-21 ENCOUNTER — Ambulatory Visit: Payer: 59

## 2020-11-21 ENCOUNTER — Other Ambulatory Visit: Payer: Self-pay

## 2020-11-21 DIAGNOSIS — M79651 Pain in right thigh: Secondary | ICD-10-CM | POA: Diagnosis not present

## 2020-11-21 DIAGNOSIS — M6281 Muscle weakness (generalized): Secondary | ICD-10-CM
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
## 2020-11-25 ENCOUNTER — Ambulatory Visit: Payer: 59

## 2020-11-25 ENCOUNTER — Other Ambulatory Visit: Payer: Self-pay

## 2020-11-25 DIAGNOSIS — M79651 Pain in right thigh: Secondary | ICD-10-CM

## 2020-11-25 DIAGNOSIS — M6281 Muscle weakness (generalized): Secondary | ICD-10-CM
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
## 2020-11-27 ENCOUNTER — Other Ambulatory Visit: Payer: Self-pay

## 2020-11-27 ENCOUNTER — Ambulatory Visit: Payer: 59

## 2020-11-27 DIAGNOSIS — M79651 Pain in right thigh: Secondary | ICD-10-CM

## 2020-11-27 DIAGNOSIS — M6281 Muscle weakness (generalized): Secondary | ICD-10-CM
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
## 2020-12-02 ENCOUNTER — Ambulatory Visit: Payer: 59

## 2020-12-02 ENCOUNTER — Other Ambulatory Visit: Payer: Self-pay

## 2020-12-02 DIAGNOSIS — M79651 Pain in right thigh: Secondary | ICD-10-CM | POA: Diagnosis not present

## 2020-12-02 DIAGNOSIS — R262 Difficulty in walking, not elsewhere classified: Secondary | ICD-10-CM

## 2020-12-02 DIAGNOSIS — M6281 Muscle weakness (generalized): Secondary | ICD-10-CM
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
## 2020-12-04 ENCOUNTER — Ambulatory Visit: Payer: 59

## 2020-12-04 ENCOUNTER — Other Ambulatory Visit: Payer: Self-pay

## 2020-12-04 DIAGNOSIS — M79651 Pain in right thigh: Secondary | ICD-10-CM | POA: Diagnosis not present

## 2020-12-04 DIAGNOSIS — M6281 Muscle weakness (generalized): Secondary | ICD-10-CM
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
## 2020-12-09 ENCOUNTER — Other Ambulatory Visit: Payer: Self-pay

## 2020-12-09 ENCOUNTER — Ambulatory Visit: Payer: 59

## 2020-12-09 DIAGNOSIS — M79651 Pain in right thigh: Secondary | ICD-10-CM | POA: Diagnosis not present

## 2020-12-09 DIAGNOSIS — M6281 Muscle weakness (generalized): Secondary | ICD-10-CM
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
## 2020-12-11 ENCOUNTER — Ambulatory Visit: Payer: 59

## 2020-12-11 ENCOUNTER — Other Ambulatory Visit: Payer: Self-pay

## 2020-12-11 DIAGNOSIS — M79651 Pain in right thigh: Secondary | ICD-10-CM

## 2020-12-11 DIAGNOSIS — M6281 Muscle weakness (generalized): Secondary | ICD-10-CM
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
## 2020-12-11 NOTE — Patient Instructions (Signed)
Access Code: [email or web address] URL: [email or web address] Date: 12/11/2020 Prepared by: Ria Comment  Exercises Supine Heel Slide with Strap (Mirrored) - 3 x daily - 7 x weekly - 2 sets - 10 reps - 5s hold Seated Knee Flexion Stretch - 3 x daily - 7 x weekly - 2 minutes progressing for longer and longer time hold Seated Hip Abduction with Resistance - 2 x daily - 7 x weekly - 2 sets - 10 reps - 5s hold Seated Long Arc Quad - 2 x daily - 7 x weekly - 2 sets - 10 reps - 5s hold Side Stepping with Resistance at Ankles and Counter Support - 1 x daily - 7 x weekly - 3 sets - 10 reps Mini Squat with Counter Support - 1 x daily - 7 x weekly - 3 sets - 10 reps Heel Raises with Counter Support - 1 x daily - 7 x weekly - 3 sets - 10 reps Standing Gastroc Stretch at Counter (Mirrored) - 1 x daily - 7 x weekly - 3 reps - 45s hold Standing Soleus Stretch at Counter (Mirrored) - 1 x daily - 7 x weekly - 3 reps - 45s hold

## 2020-12-16 ENCOUNTER — Other Ambulatory Visit: Payer: Self-pay

## 2020-12-16 ENCOUNTER — Ambulatory Visit: Payer: 59 | Attending: Student

## 2020-12-16 DIAGNOSIS — M79651 Pain in right thigh: Secondary | ICD-10-CM | POA: Diagnosis present

## 2020-12-16 DIAGNOSIS — R262 Difficulty in walking, not elsewhere classified: Secondary | ICD-10-CM | POA: Insufficient documentation

## 2020-12-16 DIAGNOSIS — R2689 Other abnormalities of gait and mobility: Secondary | ICD-10-CM | POA: Insufficient documentation

## 2020-12-16 DIAGNOSIS — M6281 Muscle weakness (generalized): Secondary | ICD-10-CM | POA: Insufficient documentation
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
## 2020-12-18 ENCOUNTER — Ambulatory Visit: Payer: 59 | Admitting: Physical Therapy

## 2020-12-18 ENCOUNTER — Other Ambulatory Visit: Payer: Self-pay

## 2020-12-18 DIAGNOSIS — M6281 Muscle weakness (generalized): Secondary | ICD-10-CM

## 2020-12-18 DIAGNOSIS — R262 Difficulty in walking, not elsewhere classified: Secondary | ICD-10-CM

## 2020-12-18 DIAGNOSIS — M79651 Pain in right thigh: Secondary | ICD-10-CM

## 2020-12-18 DIAGNOSIS — R2689 Other abnormalities of gait and mobility: Secondary | ICD-10-CM
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
## 2020-12-24 ENCOUNTER — Other Ambulatory Visit: Payer: Self-pay

## 2020-12-24 ENCOUNTER — Ambulatory Visit: Payer: 59

## 2020-12-24 DIAGNOSIS — M79651 Pain in right thigh: Secondary | ICD-10-CM

## 2020-12-24 DIAGNOSIS — M6281 Muscle weakness (generalized): Secondary | ICD-10-CM
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
## 2020-12-26 ENCOUNTER — Ambulatory Visit: Payer: 59

## 2020-12-26 ENCOUNTER — Other Ambulatory Visit: Payer: Self-pay

## 2020-12-26 DIAGNOSIS — M6281 Muscle weakness (generalized): Secondary | ICD-10-CM

## 2020-12-26 DIAGNOSIS — M79651 Pain in right thigh: Secondary | ICD-10-CM | POA: Diagnosis not present
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
## 2020-12-30 ENCOUNTER — Ambulatory Visit: Payer: 59

## 2020-12-30 DIAGNOSIS — M79651 Pain in right thigh: Secondary | ICD-10-CM | POA: Diagnosis not present

## 2020-12-30 DIAGNOSIS — M6281 Muscle weakness (generalized): Secondary | ICD-10-CM

## 2020-12-30 DIAGNOSIS — R262 Difficulty in walking, not elsewhere classified: Secondary | ICD-10-CM
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
## 2021-01-01 ENCOUNTER — Ambulatory Visit: Payer: 59

## 2021-01-01 ENCOUNTER — Other Ambulatory Visit: Payer: Self-pay

## 2021-01-01 DIAGNOSIS — R262 Difficulty in walking, not elsewhere classified: Secondary | ICD-10-CM

## 2021-01-01 DIAGNOSIS — M6281 Muscle weakness (generalized): Secondary | ICD-10-CM

## 2021-01-01 DIAGNOSIS — M79651 Pain in right thigh: Secondary | ICD-10-CM | POA: Diagnosis not present
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
## 2021-01-04 ENCOUNTER — Other Ambulatory Visit: Payer: Self-pay

## 2021-01-04 ENCOUNTER — Emergency Department
Admission: EM | Admit: 2021-01-04 | Discharge: 2021-01-04 | Disposition: A | Discharge status: Home / Self Care | Payer: 59 | Attending: Emergency Medicine | Admitting: Emergency Medicine

## 2021-01-04 DIAGNOSIS — R55 Syncope and collapse: Secondary | ICD-10-CM | POA: Insufficient documentation

## 2021-01-04 DIAGNOSIS — R42 Dizziness and giddiness: Secondary | ICD-10-CM | POA: Insufficient documentation

## 2021-01-04 LAB — COMPREHENSIVE METABOLIC PANEL
ALT: 17 U/L (ref 0–44)
AST: 22 U/L (ref 15–41)
Albumin: 3.9 g/dL (ref 3.5–5.0)
Alkaline Phosphatase: 146 U/L — ABNORMAL HIGH (ref 38–126)
Anion gap: 5 (ref 5–15)
BUN: 10 mg/dL (ref 6–20)
CO2: 25 mmol/L (ref 22–32)
Calcium: 8.7 mg/dL — ABNORMAL LOW (ref 8.9–10.3)
Chloride: 107 mmol/L (ref 98–111)
Creatinine, Ser: 1.49 mg/dL — ABNORMAL HIGH (ref 0.61–1.24)
GFR, Estimated: 59 mL/min — ABNORMAL LOW (ref >60)
Glucose, Bld: 122 mg/dL — ABNORMAL HIGH (ref 70–99)
Potassium: 3.7 mmol/L (ref 3.5–5.1)
Sodium: 137 mmol/L (ref 135–145)
Total Bilirubin: 0.4 mg/dL (ref 0.3–1.2)
Total Protein: 7.2 g/dL (ref 6.5–8.1)

## 2021-01-04 LAB — CBC
HCT: 39.6 % (ref 39.0–52.0)
Hemoglobin: 12.5 g/dL — ABNORMAL LOW (ref 13.0–17.0)
MCH: 24 pg — ABNORMAL LOW (ref 26.0–34.0)
MCHC: 31.6 g/dL (ref 30.0–36.0)
MCV: 76.2 fL — ABNORMAL LOW (ref 80.0–100.0)
Platelets: 265 10*3/uL (ref 150–400)
RBC: 5.2 MIL/uL (ref 4.22–5.81)
RDW: 16.7 % — ABNORMAL HIGH (ref 11.5–15.5)
WBC: 8.5 10*3/uL (ref 4.0–10.5)
nRBC: 0 % (ref 0.0–0.2)

## 2021-01-04 LAB — TROPONIN I (HIGH SENSITIVITY): Troponin I (High Sensitivity): 14 ng/L (ref <18)

## 2021-01-04 MED ORDER — SODIUM CHLORIDE 0.9 % IV SOLN
1000.0000 mL | Freq: Once | INTRAVENOUS | Status: AC
  Administered 2021-01-04: 1000 mL via INTRAVENOUS
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
## 2021-01-06 ENCOUNTER — Ambulatory Visit: Payer: 59

## 2021-01-06 ENCOUNTER — Other Ambulatory Visit: Payer: Self-pay

## 2021-01-06 VITALS — BP 165/97 | HR 83

## 2021-01-06 DIAGNOSIS — R262 Difficulty in walking, not elsewhere classified: Secondary | ICD-10-CM

## 2021-01-06 DIAGNOSIS — M6281 Muscle weakness (generalized): Secondary | ICD-10-CM

## 2021-01-06 DIAGNOSIS — M79651 Pain in right thigh: Secondary | ICD-10-CM | POA: Diagnosis not present
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
## 2021-01-08 ENCOUNTER — Ambulatory Visit: Payer: 59

## 2021-01-08 ENCOUNTER — Other Ambulatory Visit: Payer: Self-pay

## 2021-01-08 DIAGNOSIS — R262 Difficulty in walking, not elsewhere classified: Secondary | ICD-10-CM

## 2021-01-08 DIAGNOSIS — M6281 Muscle weakness (generalized): Secondary | ICD-10-CM

## 2021-01-08 DIAGNOSIS — M79651 Pain in right thigh: Secondary | ICD-10-CM | POA: Diagnosis not present
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
## 2021-01-13 ENCOUNTER — Other Ambulatory Visit: Payer: Self-pay

## 2021-01-13 ENCOUNTER — Ambulatory Visit: Payer: 59

## 2021-01-13 DIAGNOSIS — M6281 Muscle weakness (generalized): Secondary | ICD-10-CM

## 2021-01-13 DIAGNOSIS — R262 Difficulty in walking, not elsewhere classified: Secondary | ICD-10-CM

## 2021-01-13 DIAGNOSIS — M79651 Pain in right thigh: Secondary | ICD-10-CM | POA: Diagnosis not present
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
## 2021-01-15 ENCOUNTER — Other Ambulatory Visit: Payer: Self-pay

## 2021-01-15 ENCOUNTER — Ambulatory Visit: Payer: 59 | Attending: Student

## 2021-01-15 DIAGNOSIS — R262 Difficulty in walking, not elsewhere classified: Secondary | ICD-10-CM

## 2021-01-15 DIAGNOSIS — M6281 Muscle weakness (generalized): Secondary | ICD-10-CM
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
## 2021-01-22 ENCOUNTER — Ambulatory Visit: Payer: 59

## 2021-01-22 ENCOUNTER — Other Ambulatory Visit: Payer: Self-pay

## 2021-01-22 DIAGNOSIS — R262 Difficulty in walking, not elsewhere classified: Secondary | ICD-10-CM

## 2021-01-22 DIAGNOSIS — M6281 Muscle weakness (generalized): Secondary | ICD-10-CM | POA: Diagnosis not present
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
## 2021-01-27 ENCOUNTER — Ambulatory Visit: Payer: 59

## 2021-01-27 ENCOUNTER — Other Ambulatory Visit: Payer: Self-pay

## 2021-01-27 DIAGNOSIS — M6281 Muscle weakness (generalized): Secondary | ICD-10-CM

## 2021-01-27 DIAGNOSIS — R262 Difficulty in walking, not elsewhere classified: Secondary | ICD-10-CM
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
## 2021-02-03 ENCOUNTER — Ambulatory Visit: Payer: 59

## 2021-02-03 ENCOUNTER — Other Ambulatory Visit: Payer: Self-pay

## 2021-02-03 DIAGNOSIS — M6281 Muscle weakness (generalized): Secondary | ICD-10-CM

## 2021-02-03 DIAGNOSIS — R262 Difficulty in walking, not elsewhere classified: Secondary | ICD-10-CM
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
## 2021-02-05 ENCOUNTER — Other Ambulatory Visit: Payer: Self-pay

## 2021-02-05 ENCOUNTER — Ambulatory Visit: Payer: 59

## 2021-02-05 DIAGNOSIS — R262 Difficulty in walking, not elsewhere classified: Secondary | ICD-10-CM

## 2021-02-05 DIAGNOSIS — M6281 Muscle weakness (generalized): Secondary | ICD-10-CM | POA: Diagnosis not present
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
## 2021-02-10 ENCOUNTER — Other Ambulatory Visit: Payer: Self-pay

## 2021-02-10 ENCOUNTER — Ambulatory Visit: Payer: 59

## 2021-02-10 DIAGNOSIS — R262 Difficulty in walking, not elsewhere classified: Secondary | ICD-10-CM

## 2021-02-10 DIAGNOSIS — M6281 Muscle weakness (generalized): Secondary | ICD-10-CM
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
## 2021-02-12 ENCOUNTER — Other Ambulatory Visit: Payer: Self-pay

## 2021-02-12 ENCOUNTER — Ambulatory Visit: Payer: 59

## 2021-02-12 DIAGNOSIS — R262 Difficulty in walking, not elsewhere classified: Secondary | ICD-10-CM

## 2021-02-12 DIAGNOSIS — M6281 Muscle weakness (generalized): Secondary | ICD-10-CM
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
## 2021-02-16 ENCOUNTER — Ambulatory Visit: Payer: 59

## 2021-02-18 ENCOUNTER — Other Ambulatory Visit: Payer: Self-pay

## 2021-02-18 ENCOUNTER — Ambulatory Visit: Payer: 59 | Attending: Student | Admitting: Physical Therapy

## 2021-02-18 DIAGNOSIS — R262 Difficulty in walking, not elsewhere classified: Secondary | ICD-10-CM | POA: Insufficient documentation

## 2021-02-18 DIAGNOSIS — R2689 Other abnormalities of gait and mobility: Secondary | ICD-10-CM | POA: Diagnosis present

## 2021-02-18 DIAGNOSIS — M79651 Pain in right thigh: Secondary | ICD-10-CM | POA: Insufficient documentation

## 2021-02-18 DIAGNOSIS — M6281 Muscle weakness (generalized): Secondary | ICD-10-CM | POA: Insufficient documentation
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
## 2021-02-19 ENCOUNTER — Encounter: Payer: Self-pay | Admitting: Physical Therapy

## 2021-02-23 ENCOUNTER — Ambulatory Visit: Payer: 59

## 2021-02-23 ENCOUNTER — Other Ambulatory Visit: Payer: Self-pay

## 2021-02-23 DIAGNOSIS — M6281 Muscle weakness (generalized): Secondary | ICD-10-CM

## 2021-02-23 DIAGNOSIS — R262 Difficulty in walking, not elsewhere classified: Secondary | ICD-10-CM
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
## 2021-02-25 ENCOUNTER — Encounter: Payer: 59 | Admitting: Physical Therapy

## 2021-02-26 ENCOUNTER — Other Ambulatory Visit: Payer: Self-pay

## 2021-02-26 ENCOUNTER — Ambulatory Visit: Payer: 59 | Admitting: Physical Therapy

## 2021-02-26 ENCOUNTER — Encounter: Payer: Self-pay | Admitting: Physical Therapy

## 2021-02-26 DIAGNOSIS — M79651 Pain in right thigh: Secondary | ICD-10-CM

## 2021-02-26 DIAGNOSIS — M6281 Muscle weakness (generalized): Secondary | ICD-10-CM

## 2021-02-26 DIAGNOSIS — R2689 Other abnormalities of gait and mobility: Secondary | ICD-10-CM

## 2021-02-26 DIAGNOSIS — R262 Difficulty in walking, not elsewhere classified: Secondary | ICD-10-CM
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
## 2021-03-02 ENCOUNTER — Ambulatory Visit: Payer: 59

## 2021-03-02 ENCOUNTER — Other Ambulatory Visit: Payer: Self-pay

## 2021-03-02 DIAGNOSIS — R262 Difficulty in walking, not elsewhere classified: Secondary | ICD-10-CM

## 2021-03-02 DIAGNOSIS — M6281 Muscle weakness (generalized): Secondary | ICD-10-CM | POA: Diagnosis not present
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
## 2021-03-04 ENCOUNTER — Ambulatory Visit: Payer: 59 | Admitting: Physical Therapy

## 2021-03-04 ENCOUNTER — Other Ambulatory Visit: Payer: Self-pay

## 2021-03-04 DIAGNOSIS — R262 Difficulty in walking, not elsewhere classified: Secondary | ICD-10-CM

## 2021-03-04 DIAGNOSIS — M6281 Muscle weakness (generalized): Secondary | ICD-10-CM

## 2021-03-04 DIAGNOSIS — R2689 Other abnormalities of gait and mobility: Secondary | ICD-10-CM

## 2021-03-04 DIAGNOSIS — M79651 Pain in right thigh: Secondary | ICD-10-CM
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
## 2021-03-06 ENCOUNTER — Encounter: Payer: Self-pay | Admitting: Physical Therapy

## 2021-03-09 ENCOUNTER — Other Ambulatory Visit: Payer: Self-pay

## 2021-03-09 ENCOUNTER — Ambulatory Visit: Payer: 59

## 2021-03-09 DIAGNOSIS — M6281 Muscle weakness (generalized): Secondary | ICD-10-CM | POA: Diagnosis not present

## 2021-03-09 DIAGNOSIS — R262 Difficulty in walking, not elsewhere classified: Secondary | ICD-10-CM
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
## 2021-03-11 ENCOUNTER — Ambulatory Visit: Payer: 59 | Admitting: Physical Therapy

## 2021-03-11 NOTE — Patient Instructions (Incomplete)
°  °  °  °  TREATMENT     Ther-ex SciFit L4-5, BLE only for warm-up during history, adjusting resistance to adjust challenge for patient; 6" L heel taps 2 x 10; R single-leg heel raise, edge of step; 2 x 10 BLE; Total Gym (TG) Level 15 double leg jumps x 10, R single leg jumps x 10; TRX reverse lunges x 10 BLE; 6" R lateral step-ups  Resisted side stepping with black tband around ankles 30' x 4; Monster walks with black tband around ankles 30' x 4;     Pt educated throughout session about proper posture and technique with exercises. Improved exercise technique, movement at target joints, use of target muscles after min to mod verbal, visual, tactile cues.       Pt demonstrates excellent motivation during session today. He arrived late so session was abbreviated. Continued with lower extremity strengthening exercises today and added R single leg Total Gym jumping to work on explosive RLE power. He is making excellent progress with respect to RLE strength.  Patient encouraged to continue HEP and continue working out in the gym. He will benefit from additional PT services to address deficits in strength, pain, and mobility in order to return to full function at home and work.

## 2021-03-16 ENCOUNTER — Ambulatory Visit: Payer: 59

## 2021-03-16 ENCOUNTER — Other Ambulatory Visit: Payer: Self-pay

## 2021-03-16 DIAGNOSIS — M6281 Muscle weakness (generalized): Secondary | ICD-10-CM | POA: Diagnosis not present

## 2021-03-16 DIAGNOSIS — R2689 Other abnormalities of gait and mobility: Secondary | ICD-10-CM

## 2021-03-16 DIAGNOSIS — R262 Difficulty in walking, not elsewhere classified: Secondary | ICD-10-CM
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
## 2021-03-26 ENCOUNTER — Ambulatory Visit: Payer: 59 | Attending: Student | Admitting: Physical Therapy

## 2021-03-26 ENCOUNTER — Other Ambulatory Visit: Payer: Self-pay

## 2021-03-26 DIAGNOSIS — R262 Difficulty in walking, not elsewhere classified: Secondary | ICD-10-CM | POA: Insufficient documentation

## 2021-03-26 DIAGNOSIS — R2689 Other abnormalities of gait and mobility: Secondary | ICD-10-CM | POA: Insufficient documentation

## 2021-03-26 DIAGNOSIS — M79651 Pain in right thigh: Secondary | ICD-10-CM | POA: Insufficient documentation

## 2021-03-26 DIAGNOSIS — M6281 Muscle weakness (generalized): Secondary | ICD-10-CM | POA: Insufficient documentation

## 2021-04-02 ENCOUNTER — Ambulatory Visit: Payer: 59 | Admitting: Physical Therapy

## 2021-04-02 ENCOUNTER — Encounter: Payer: Self-pay | Admitting: Physical Therapy

## 2021-04-02 ENCOUNTER — Other Ambulatory Visit: Payer: Self-pay

## 2021-04-02 DIAGNOSIS — M79651 Pain in right thigh: Secondary | ICD-10-CM | POA: Diagnosis present

## 2021-04-02 DIAGNOSIS — M6281 Muscle weakness (generalized): Secondary | ICD-10-CM | POA: Diagnosis not present

## 2021-04-02 DIAGNOSIS — R262 Difficulty in walking, not elsewhere classified: Secondary | ICD-10-CM | POA: Diagnosis present

## 2021-04-02 DIAGNOSIS — R2689 Other abnormalities of gait and mobility: Secondary | ICD-10-CM
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
## 2021-04-04 NOTE — Therapy (Signed)
[facility] [address] [place], [place], [address] Phone: [phone]   Fax:  [phone]  Patient Details  Name: Lawrence [name] MRN: [id number] Date of Birth: 1976/02/03 Referring Provider:  Roby Lofts, MD  Encounter Date: 03/26/2021   This note is open in error. Patient no-showed for this scheduled appointment.    Consuela Mimes, PT, DPT #[id number]  Gertie Exon 04/04/2021, 3:54 PM  [facility] [address] [place], [place], [address] Phone: [phone]   Fax:  [phone]

## 2021-05-07 ENCOUNTER — Ambulatory Visit
Admission: EM | Admit: 2021-05-07 | Discharge: 2021-05-07 | Disposition: A | Discharge status: Home / Self Care | Payer: 59 | Attending: Emergency Medicine | Admitting: Emergency Medicine

## 2021-05-07 ENCOUNTER — Other Ambulatory Visit: Payer: Self-pay

## 2021-05-07 DIAGNOSIS — U071 COVID-19: Secondary | ICD-10-CM

## 2021-05-07 MED ORDER — IPRATROPIUM BROMIDE 0.06 % NA SOLN: 2.0000 | Freq: Four times a day (QID) | NASAL | 12 refills | Status: DC

## 2021-05-07 MED ORDER — MOLNUPIRAVIR EUA 200MG CAPSULE: 4.0000 | ORAL_CAPSULE | Freq: Two times a day (BID) | ORAL | 0 refills | Status: AC

## 2021-05-07 MED ORDER — PROMETHAZINE-DM 6.25-15 MG/5ML PO SYRP: 5.0000 mL | ORAL_SOLUTION | Freq: Four times a day (QID) | ORAL | 0 refills | Status: DC | PRN

## 2021-05-07 MED ORDER — BENZONATATE 100 MG PO CAPS: 200.0000 mg | ORAL_CAPSULE | Freq: Three times a day (TID) | ORAL | 0 refills | Status: DC

## 2021-05-07 NOTE — ED Triage Notes (Signed)
Pt here with C/O of Covid positive test at home on Tuesday. SX started Monday.
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
## 2021-05-07 NOTE — Discharge Instructions (Signed)
You will have to quarantine for 5 days from the start of your symptoms.  After 5 days you can break quarantine if your symptoms have improved and you have not had a fever for 24 hours without taking Tylenol or ibuprofen. ° °Use over-the-counter Tylenol and ibuprofen as needed for body aches and fever. ° °Use the Atrovent nasal spray, 2 squirts in each nostril every 6 hours, as needed for runny nose and postnasal drip. ° °Use the Tessalon Perles every 8 hours during the day.  Take them with a small sip of water.  They may give you some numbness to the base of your tongue or a metallic taste in your mouth, this is normal. ° °Use the Promethazine DM cough syrup at bedtime for cough and congestion.  It will make you drowsy so do not take it during the day. ° °If you develop any increased shortness of breath-especially at rest, you are unable to speak in full sentences, or is a late sign your lips are turning blue you need to go the ER for evaluation.  °
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
## 2021-12-21 ENCOUNTER — Ambulatory Visit (INDEPENDENT_AMBULATORY_CARE_PROVIDER_SITE_OTHER): Payer: BC Managed Care – PPO

## 2021-12-21 ENCOUNTER — Other Ambulatory Visit: Payer: Self-pay

## 2021-12-21 ENCOUNTER — Encounter: Payer: Self-pay | Admitting: Emergency Medicine

## 2021-12-21 ENCOUNTER — Ambulatory Visit
Admission: EM | Admit: 2021-12-21 | Discharge: 2021-12-21 | Disposition: A | Discharge status: Home / Self Care | Payer: BC Managed Care – PPO | Attending: Nurse Practitioner | Admitting: Nurse Practitioner

## 2021-12-21 DIAGNOSIS — W19XXXA Unspecified fall, initial encounter: Secondary | ICD-10-CM

## 2021-12-21 DIAGNOSIS — M542 Cervicalgia: Secondary | ICD-10-CM | POA: Diagnosis not present

## 2021-12-21 DIAGNOSIS — M25572 Pain in left ankle and joints of left foot: Secondary | ICD-10-CM

## 2021-12-21 DIAGNOSIS — S0993XA Unspecified injury of face, initial encounter: Secondary | ICD-10-CM | POA: Diagnosis not present

## 2021-12-21 DIAGNOSIS — W109XXA Fall (on) (from) unspecified stairs and steps, initial encounter: Secondary | ICD-10-CM | POA: Diagnosis not present

## 2021-12-21 DIAGNOSIS — S0081XA Abrasion of other part of head, initial encounter: Secondary | ICD-10-CM | POA: Diagnosis not present

## 2021-12-21 NOTE — ED Triage Notes (Addendum)
Reports falling last night around 9:30 pm.  Patient reports right knee gave away, no safety rails on steps and fell. Patient reports falling down 4 steps.   Reports left ankle swelled, foot was hurting.  Patient landed on face on asphalt.  Patient reports upper left, front tooth is sore, upper lip is swollen and painful tooth cut lip on under side of upper lip.  Abrasion and swelling to left side of face, back of left hand with abrasion.  Pain to turn neck either direction, but especially to right side.  Denies loc .  Left ankle and foot is better.  Patient says he could not go to work today.  Patient has not taken any medications for pain
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
## 2021-12-21 NOTE — Discharge Instructions (Addendum)
Xray normal and negative for acute injury. We encourage conservative treatment with symptom relief. We encourage you to use Tylenol alternating with Ibuprofen for your fever if not contraindicated. (Remember to use as directed do not exceed daily dosing recommendations)

## 2024-02-28 ENCOUNTER — Encounter: Payer: Self-pay | Admitting: Emergency Medicine

## 2024-02-28 ENCOUNTER — Ambulatory Visit (INDEPENDENT_AMBULATORY_CARE_PROVIDER_SITE_OTHER)

## 2024-02-28 ENCOUNTER — Ambulatory Visit
Admission: EM | Admit: 2024-02-28 | Discharge: 2024-02-28 | Attending: Emergency Medicine | Admitting: Emergency Medicine

## 2024-02-28 DIAGNOSIS — R350 Frequency of micturition: Secondary | ICD-10-CM | POA: Insufficient documentation

## 2024-02-28 DIAGNOSIS — N179 Acute kidney failure, unspecified: Secondary | ICD-10-CM | POA: Diagnosis not present

## 2024-02-28 DIAGNOSIS — M25552 Pain in left hip: Secondary | ICD-10-CM

## 2024-02-28 DIAGNOSIS — M25551 Pain in right hip: Secondary | ICD-10-CM

## 2024-02-28 DIAGNOSIS — I160 Hypertensive urgency: Secondary | ICD-10-CM | POA: Insufficient documentation

## 2024-02-28 LAB — BASIC METABOLIC PANEL WITH GFR
Anion gap: 11 (ref 5–15)
BUN: 18 mg/dL (ref 6–20)
CO2: 23 mmol/L (ref 22–32)
Calcium: 8.9 mg/dL (ref 8.9–10.3)
Chloride: 105 mmol/L (ref 98–111)
Creatinine, Ser: 2.7 mg/dL — ABNORMAL HIGH (ref 0.61–1.24)
GFR, Estimated: 28 mL/min — ABNORMAL LOW (ref >60)
Glucose, Bld: 98 mg/dL (ref 70–99)
Potassium: 3.3 mmol/L — ABNORMAL LOW (ref 3.5–5.1)
Sodium: 139 mmol/L (ref 135–145)

## 2024-02-28 LAB — URINALYSIS, W/ REFLEX TO CULTURE (INFECTION SUSPECTED)
Bilirubin Urine: NEGATIVE
Glucose, UA: NEGATIVE mg/dL
Ketones, ur: NEGATIVE mg/dL
Leukocytes,Ua: NEGATIVE
Nitrite: NEGATIVE
Protein, ur: NEGATIVE mg/dL
Specific Gravity, Urine: 1.01 (ref 1.005–1.030)
pH: 5.5 (ref 5.0–8.0)
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
## 2024-02-28 NOTE — Discharge Instructions (Addendum)
 Please go to the emergency department at [facility] to be evaluated for your elevated blood pressure and your abnormal kidney function.  Please go now.

## 2024-02-28 NOTE — ED Notes (Signed)
 Patient is being discharged from the Urgent Care and sent to the Emergency Department via POV . Per Venetia Motto NP, patient is in need of higher level of care due to AKI. Patient is aware and verbalizes understanding of plan of care.  Vitals:   02/28/24 1511  BP: (!) 180/132  Pulse: 75  Resp: 16  Temp: 98.8 F (37.1 C)  SpO2: 95%

## 2024-02-29 LAB — CYTOLOGY, (ORAL, ANAL, URETHRAL) ANCILLARY ONLY
Chlamydia: NEGATIVE
Comment: NEGATIVE
Comment: NEGATIVE
Comment: NORMAL
Neisseria Gonorrhea: NEGATIVE
Trichomonas: NEGATIVE
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
## 2024-03-13 ENCOUNTER — Emergency Department
Admission: EM | Admit: 2024-03-13 | Discharge: 2024-03-13 | Disposition: A | Discharge status: Home / Self Care | Attending: Emergency Medicine | Admitting: Emergency Medicine

## 2024-03-13 DIAGNOSIS — N3000 Acute cystitis without hematuria: Secondary | ICD-10-CM | POA: Insufficient documentation

## 2024-03-13 DIAGNOSIS — I10 Essential (primary) hypertension: Secondary | ICD-10-CM | POA: Insufficient documentation

## 2024-03-13 DIAGNOSIS — R350 Frequency of micturition: Secondary | ICD-10-CM

## 2024-03-13 LAB — URINALYSIS, ROUTINE W REFLEX MICROSCOPIC
Bilirubin Urine: NEGATIVE
Glucose, UA: NEGATIVE mg/dL
Ketones, ur: NEGATIVE mg/dL
Nitrite: POSITIVE — AB
Protein, ur: 100 mg/dL — AB
RBC / HPF: >50 RBC/hpf (ref 0–5)
Specific Gravity, Urine: 1.016 (ref 1.005–1.030)
Squamous Epithelial / HPF: 0 /HPF (ref 0–5)
WBC, UA: >50 WBC/hpf (ref 0–5)
pH: 6 (ref 5.0–8.0)

## 2024-03-13 LAB — CBC WITH DIFFERENTIAL/PLATELET
Abs Immature Granulocytes: 0.02 K/uL (ref 0.00–0.07)
Basophils Absolute: 0 K/uL (ref 0.0–0.1)
Basophils Relative: 0 %
Eosinophils Absolute: 0.1 K/uL (ref 0.0–0.5)
Eosinophils Relative: 1 %
HCT: 38.5 % — ABNORMAL LOW (ref 39.0–52.0)
Hemoglobin: 12.5 g/dL — ABNORMAL LOW (ref 13.0–17.0)
Immature Granulocytes: 0 %
Lymphocytes Relative: 14 %
Lymphs Abs: 1.1 K/uL (ref 0.7–4.0)
MCH: 27 pg (ref 26.0–34.0)
MCHC: 32.5 g/dL (ref 30.0–36.0)
MCV: 83.2 fL (ref 80.0–100.0)
Monocytes Absolute: 0.8 K/uL (ref 0.1–1.0)
Monocytes Relative: 11 %
Neutro Abs: 5.6 K/uL (ref 1.7–7.7)
Neutrophils Relative %: 74 %
Platelets: 321 K/uL (ref 150–400)
RBC: 4.63 MIL/uL (ref 4.22–5.81)
RDW: 13.5 % (ref 11.5–15.5)
WBC: 7.6 K/uL (ref 4.0–10.5)
nRBC: 0 % (ref 0.0–0.2)

## 2024-03-13 LAB — COMPREHENSIVE METABOLIC PANEL WITH GFR
ALT: 17 U/L (ref 0–44)
AST: 19 U/L (ref 15–41)
Albumin: 3.8 g/dL (ref 3.5–5.0)
Alkaline Phosphatase: 76 U/L (ref 38–126)
Anion gap: 10 (ref 5–15)
BUN: 22 mg/dL — ABNORMAL HIGH (ref 6–20)
CO2: 22 mmol/L (ref 22–32)
Calcium: 8.8 mg/dL — ABNORMAL LOW (ref 8.9–10.3)
Chloride: 104 mmol/L (ref 98–111)
Creatinine, Ser: 2.27 mg/dL — ABNORMAL HIGH (ref 0.61–1.24)
GFR, Estimated: 35 mL/min — ABNORMAL LOW (ref >60)
Glucose, Bld: 113 mg/dL — ABNORMAL HIGH (ref 70–99)
Potassium: 3.9 mmol/L (ref 3.5–5.1)
Sodium: 136 mmol/L (ref 135–145)
Total Bilirubin: 0.5 mg/dL (ref 0.0–1.2)
Total Protein: 8.2 g/dL — ABNORMAL HIGH (ref 6.5–8.1)

## 2024-03-13 MED ORDER — CEPHALEXIN 500 MG PO CAPS: 500.0000 mg | ORAL_CAPSULE | Freq: Two times a day (BID) | ORAL | 0 refills | Status: AC

## 2024-03-13 NOTE — Discharge Instructions (Signed)
 You were seen in the emergency department today for urinary symptoms.  I suspect this is likely related to an infection of your urinary tract.  I sent a prescription for antibiotics to your pharmacy.  Continue to perform your catheterization as directed by your urologist and follow-up with them for further evaluation.  Return to the ER for new or worsening symptoms.

## 2024-03-13 NOTE — ED Provider Notes (Signed)
 [facility] Provider Note    Event Date/Time   First MD Initiated Contact with Patient 03/13/24 1114     (approximate)   History   Dysuria   HPI  Lawrence [name] is a 48 year old male with history of hypertension and urinary retention presenting to the emergency department for evaluation of urinary discomfort.  Patient had his Foley catheter removed yesterday.  Last night into today he has had significant discomfort when going to the bathroom.  Does note that when he had a catheter during his recent admission he had initially had significant bleeding and pain improved after repositioning of his catheter.  No recent urine infection.  No fevers.  Does report that he frequently is going to the bathroom with small volume voids.  Reviewed discharge summary from [facility] system from 03/02/2024.  At that time patient presented with hip pain found to have acute urinary retention with bladder outlet obstruction.  Foley catheter was placed with with return of over 1 L of urine with suspected AKI secondary to hydronephrosis.  He was discharged with a Foley and plan for outpatient void trial.  There, patient was hesitant about catheter placement so instead he was discharged with plan for clean intermittent catheterization 4 times a day.  Patient reports he has been performing this.     Physical Exam   Triage Vital Signs: ED Triage Vitals  Encounter Vitals Group     BP 03/13/24 1006 (!) 137/99     Girls Systolic BP Percentile --      Girls Diastolic BP Percentile --      Boys Systolic BP Percentile --      Boys Diastolic BP Percentile --      Pulse Rate 03/13/24 1006 87     Resp 03/13/24 1006 18     Temp 03/13/24 1006 98.1 F (36.7 C)     Temp Source 03/13/24 1006 Oral     SpO2 03/13/24 1006 100 %     Weight 03/13/24 1008 259 lb (117.5 kg)     Height 03/13/24 1008 6' 2 (1.88 m)     Head Circumference --      Peak Flow --      Pain Score 03/13/24 1008 0     Pain Loc --       Pain Education --      Exclude from Growth Chart --     Most recent vital signs: Vitals:   03/13/24 1006  BP: (!) 137/99  Pulse: 87  Resp: 18  Temp: 98.1 F (36.7 C)  SpO2: 100%     General: Awake, interactive  CV:  Good peripheral perfusion Resp:  Unlabored respirations Abd:  Nondistended soft, suprapubic tenderness, remainder of abdomen nontender Neuro:  Symmetric facial movement, fluid speech   ED Results / Procedures / Treatments   Labs (all labs ordered are listed, but only abnormal results are displayed) Labs Reviewed  URINALYSIS, ROUTINE W REFLEX MICROSCOPIC - Abnormal; Notable for the following components:      Result Value   Color, Urine YELLOW (*)    APPearance TURBID (*)    Hgb urine dipstick MODERATE (*)    Protein, ur 100 (*)    Nitrite POSITIVE (*)    Leukocytes,Ua LARGE (*)    Bacteria, UA MANY (*)    All other components within normal limits  CBC WITH DIFFERENTIAL/PLATELET - Abnormal; Notable for the following components:   Hemoglobin 12.5 (*)    HCT 38.5 (*)  All other components within normal limits  COMPREHENSIVE METABOLIC PANEL WITH GFR - Abnormal; Notable for the following components:   Glucose, Bld 113 (*)    BUN 22 (*)    Creatinine, Ser 2.27 (*)    Calcium 8.8 (*)    Total Protein 8.2 (*)    GFR, Estimated 35 (*)    All other components within normal limits  URINE CULTURE     EKG EKG independently reviewed and interpreted by myself demonstrates:    RADIOLOGY Imaging independently reviewed and interpreted by myself demonstrates:   Formal Radiology Read:  No results found.  PROCEDURES:  Critical Care performed: No  Procedures   MEDICATIONS ORDERED IN ED: Medications - No data to display   IMPRESSION / MDM / ASSESSMENT AND PLAN / ED COURSE  I reviewed the triage vital signs and the nursing notes.  Differential diagnosis includes, but is not limited to, urinary discomfort related to UTI, irritation of urinary  tract in the setting of recent Foley placement, possible traumatic injury, discomfort related to ongoing urinary retention  Patient's presentation is most consistent with acute presentation with potential threat to life or bodily function.  48 year old male presenting to the emergency department for evaluation of discomfort when urinating.  Labs with mild anemia, no ongoing hematuria reported.  CMP with impaired renal function with creatinine of 2.27, this is minimally increased from 2.17 at the time of discharge, decreased from initial creatinine of 2.52.  No recent prior for several years prior to this.  Will obtain urinalysis.    Clinical Course as of 03/13/24 1307  Tue Mar 13, 2024  1304 Urinalysis was concerning for infection with greater than 50 white blood cells, many bacteria, positive nitrate, large leukocyte esterase.  Urine culture sent.  Suspect this is the likely etiology of patient's new urinary symptoms, may also have some component of irritation from recent catheter.  Reviewed results of workup with patient.  Normal white blood cell count, no evidence of sepsis.  Do think he is stable for discharge with outpatient antibiotics and follow-up.  Patient comfortable with this plan.  Will DC with prescription for Keflex.  Has penicillin allergy, but has received Ancef  in the past without reaction.  Strict return precautions provided.  Patient discharged in stable condition. [NR]    Clinical Course User Index [NR] Levander Slate, MD     FINAL CLINICAL IMPRESSION(S) / ED DIAGNOSES   Final diagnoses:  Acute cystitis without hematuria  Urinary frequency     Rx / DC Orders   ED Discharge Orders          Ordered    cephALEXin (KEFLEX) 500 MG capsule  2 times daily        03/13/24 1307             Note:  This document was prepared using Dragon voice recognition software and may include unintentional dictation errors.   Levander Slate, MD 03/13/24 [phone]

## 2024-03-13 NOTE — ED Triage Notes (Signed)
 Pt presents to the ED via POV from home. Pt states that he had an acute kidney injury recently and was hospitalized with a urinary catheter in place. Pt states that they removed the catheter yesterday. Pt states that there was issues with the catheter and states that he bleed the whole time. Pt reports urinary frequency and significant dysuria. Denies other sx's. Reports hospitalization was about two weeks ago.

## 2024-03-15 LAB — URINE CULTURE: Culture: >=100000 — AB

## 2024-05-14 ENCOUNTER — Other Ambulatory Visit: Payer: Self-pay | Admitting: Nephrology

## 2024-05-14 DIAGNOSIS — N179 Acute kidney failure, unspecified: Secondary | ICD-10-CM

## 2024-05-14 DIAGNOSIS — N1330 Unspecified hydronephrosis: Secondary | ICD-10-CM

## 2024-05-21 ENCOUNTER — Ambulatory Visit
Admission: RE | Admit: 2024-05-21 | Discharge: 2024-05-21 | Disposition: A | Discharge status: Home / Self Care | Payer: Self-pay | Source: Ambulatory Visit | Attending: Nephrology | Admitting: Nephrology

## 2024-05-21 DIAGNOSIS — N1330 Unspecified hydronephrosis: Secondary | ICD-10-CM | POA: Insufficient documentation

## 2024-05-21 DIAGNOSIS — N179 Acute kidney failure, unspecified: Secondary | ICD-10-CM | POA: Insufficient documentation
